# Patient Record
Sex: Female | Born: 1995 | Race: Black or African American | Hispanic: No | Marital: Single | Smoking: Never smoker
Health system: Southern US, Community
[De-identification: ages and names within clinical notes are randomized; demographics above are authoritative.]

## PROBLEM LIST (undated history)

## (undated) ENCOUNTER — Inpatient Hospital Stay (HOSPITAL_COMMUNITY): Payer: Self-pay

## (undated) DIAGNOSIS — O139 Gestational [pregnancy-induced] hypertension without significant proteinuria, unspecified trimester: Secondary | ICD-10-CM

## (undated) DIAGNOSIS — Z789 Other specified health status: Secondary | ICD-10-CM

## (undated) HISTORY — PX: NO PAST SURGERIES: SHX2092

## (undated) HISTORY — DX: Gestational (pregnancy-induced) hypertension without significant proteinuria, unspecified trimester: O13.9

---

## 2016-06-15 NOTE — L&D Delivery Note (Signed)
Delivery Note At 4:44 PM a viable female was delivered via Vaginal, Spontaneous (Presentation:LOA).  APGAR: pending weight: pending.   Placenta status:3v cord- intact  with the following complications: none   Anesthesia:  Epidural  Episiotomy: None Lacerations: small 1st degree lac- not repaired Est. Blood Loss (mL): 300  Mom to postpartum.  Baby to Couplet care / Skin to Skin.  Willodean RosenthalCarolyn Harraway-Smith 06/13/2017, 4:59 PM

## 2017-02-08 ENCOUNTER — Other Ambulatory Visit (HOSPITAL_COMMUNITY)
Admission: RE | Admit: 2017-02-08 | Discharge: 2017-02-08 | Disposition: A | Discharge status: Home / Self Care | Payer: Medicaid Other | Source: Ambulatory Visit | Attending: Advanced Practice Midwife | Admitting: Advanced Practice Midwife

## 2017-02-08 ENCOUNTER — Ambulatory Visit (INDEPENDENT_AMBULATORY_CARE_PROVIDER_SITE_OTHER): Payer: Medicaid Other | Admitting: Advanced Practice Midwife

## 2017-02-08 ENCOUNTER — Encounter: Payer: Self-pay | Admitting: Advanced Practice Midwife

## 2017-02-08 VITALS — BP 125/77 | HR 101 | Ht 71.0 in | Wt 222.0 lb

## 2017-02-08 DIAGNOSIS — Z3A19 19 weeks gestation of pregnancy: Secondary | ICD-10-CM | POA: Diagnosis not present

## 2017-02-08 DIAGNOSIS — Z3481 Encounter for supervision of other normal pregnancy, first trimester: Secondary | ICD-10-CM

## 2017-02-08 DIAGNOSIS — O0932 Supervision of pregnancy with insufficient antenatal care, second trimester: Secondary | ICD-10-CM | POA: Diagnosis not present

## 2017-02-08 DIAGNOSIS — O26842 Uterine size-date discrepancy, second trimester: Secondary | ICD-10-CM | POA: Insufficient documentation

## 2017-02-08 DIAGNOSIS — Z3400 Encounter for supervision of normal first pregnancy, unspecified trimester: Secondary | ICD-10-CM

## 2017-02-08 DIAGNOSIS — Z3402 Encounter for supervision of normal first pregnancy, second trimester: Secondary | ICD-10-CM

## 2017-02-08 DIAGNOSIS — Z363 Encounter for antenatal screening for malformations: Secondary | ICD-10-CM

## 2017-02-08 MED ORDER — CONCEPT DHA 53.5-38-1 MG PO CAPS: 1.0000 | ORAL_CAPSULE | Freq: Every day | ORAL | 12 refills | Status: DC

## 2017-02-08 NOTE — Patient Instructions (Signed)

## 2017-02-08 NOTE — Progress Notes (Signed)
[facility] Pt states she was given a due date of 07/04/16, by u/s there. Pt does not know date of last cycle.

## 2017-02-09 LAB — CERVICOVAGINAL ANCILLARY ONLY
Bacterial vaginitis: POSITIVE — AB
CANDIDA VAGINITIS: NEGATIVE
Chlamydia: NEGATIVE
Neisseria Gonorrhea: NEGATIVE
Trichomonas: NEGATIVE

## 2017-02-10 LAB — OBSTETRIC PANEL, INCLUDING HIV
ANTIBODY SCREEN: NEGATIVE
BASOS: 0 %
Basophils Absolute: 0 10*3/uL (ref 0.0–0.2)
EOS (ABSOLUTE): 0.1 10*3/uL (ref 0.0–0.4)
Eos: 1 %
HEMATOCRIT: 33.9 % — AB (ref 34.0–46.6)
HIV SCREEN 4TH GENERATION: NONREACTIVE
Hemoglobin: 11.1 g/dL (ref 11.1–15.9)
Hepatitis B Surface Ag: NEGATIVE
Immature Grans (Abs): 0 10*3/uL (ref 0.0–0.1)
Immature Granulocytes: 0 %
LYMPHS ABS: 1.1 10*3/uL (ref 0.7–3.1)
Lymphs: 20 %
MCH: 27 pg (ref 26.6–33.0)
MCHC: 32.7 g/dL (ref 31.5–35.7)
MCV: 83 fL (ref 79–97)
MONOS ABS: 0.4 10*3/uL (ref 0.1–0.9)
Monocytes: 7 %
NEUTROS ABS: 4 10*3/uL (ref 1.4–7.0)
Neutrophils: 72 %
Platelets: 235 10*3/uL (ref 150–379)
RBC: 4.11 x10E6/uL (ref 3.77–5.28)
RDW: 15.5 % — AB (ref 12.3–15.4)
RPR Ser Ql: NONREACTIVE
Rh Factor: POSITIVE
Rubella Antibodies, IGG: 12.3 index (ref >0.99)
WBC: 5.7 10*3/uL (ref 3.4–10.8)

## 2017-02-10 LAB — CULTURE, OB URINE

## 2017-02-10 LAB — HEMOGLOBINOPATHY EVALUATION
HEMOGLOBIN A2 QUANTITATION: 2.4 % (ref 1.8–3.2)
HGB A: 97.6 % (ref 96.4–98.8)
HGB C: 0 %
HGB S: 0 %
HGB VARIANT: 0 %
Hemoglobin F Quantitation: 0 % (ref 0.0–2.0)

## 2017-02-10 LAB — URINE CULTURE, OB REFLEX

## 2017-02-10 LAB — VARICELLA ZOSTER ANTIBODY, IGG: Varicella zoster IgG: >4000 index (ref >165)

## 2017-02-10 LAB — VITAMIN D 25 HYDROXY (VIT D DEFICIENCY, FRACTURES): Vit D, 25-Hydroxy: 22.5 ng/mL — ABNORMAL LOW (ref 30.0–100.0)

## 2017-02-11 NOTE — Progress Notes (Signed)
Subjective:    Audrey Butler is being seen today for her first obstetrical visit.  This is not a planned pregnancy. She is 13 week by uncertain LMP, but at 3142w1d gestation [facility]. Does not have records. Her obstetrical history is significant for nulliparity.  Relationship with FOB: significant other, living together. Patient does intend to breast feed. Pregnancy history fully reviewed.  Patient reports no complaints.  Review of Systems:   Review of Systems  Constitutional: Negative for chills and fever.  Gastrointestinal: Negative for abdominal pain, nausea and vomiting.  Genitourinary: Negative for vaginal bleeding and vaginal discharge.    Objective:     BP 125/77   Pulse (!) 101   Ht 5\' 11"  (1.803 m)   Wt 222 lb (100.7 kg)   LMP 11/08/2016   BMI 30.96 kg/m  Physical Exam  Constitutional: She is oriented to person, place, and time. She appears well-developed and well-nourished. No distress.  Eyes: No scleral icterus.  Cardiovascular: Normal rate and regular rhythm.   Respiratory: Effort normal and breath sounds normal. No respiratory distress.  GI: Soft. There is no tenderness.  Fundal ht 1/U  Genitourinary: Vagina normal. No vaginal discharge found.  Musculoskeletal: She exhibits no edema.  Neurological: She is alert and oriented to person, place, and time. She has normal reflexes.  Skin: Skin is warm and dry.  Psychiatric: She has a normal mood and affect.    Maternal Exam:  Introitus: Vagina is negative for discharge.    Pos FHR   Assessment:    Pregnancy: G1P0 Patient Active Problem List   Diagnosis Date Noted  . Supervision of normal first pregnancy, antepartum 02/08/2017     1. Supervision of normal first pregnancy, antepartum  - Hemoglobinopathy evaluation - Varicella zoster antibody, IgG - VITAMIN D 25 Hydroxy (Vit-D Deficiency, Fractures) - Culture, OB Urine - Obstetric Panel, Including HIV - Cervicovaginal ancillary  only - Enroll Patient in Babyscripts - Babyscripts Schedule Optimization - US MFM OB DETAIL +14 WK; Future - Prenat-FeFum-FePo-FA-Omega 3 (CONCEPT DHA) 53.5-38-1 MG CAPS; Take 1 tablet by mouth daily.  Dispense: 30 capsule; Refill: 12  2. Limited prenatal care in second trimester  - Hemoglobinopathy evaluation - Varicella zoster antibody, IgG - VITAMIN D 25 Hydroxy (Vit-D Deficiency, Fractures) - Culture, OB Urine - Obstetric Panel, Including HIV - Cervicovaginal ancillary only - Enroll Patient in Babyscripts - Babyscripts Schedule Optimization - US MFM OB DETAIL +14 WK; Future  3. Uterine size date discrepancy pregnancy, second trimester  - Hemoglobinopathy evaluation - Varicella zoster antibody, IgG - VITAMIN D 25 Hydroxy (Vit-D Deficiency, Fractures) - Culture, OB Urine - Obstetric Panel, Including HIV - Cervicovaginal ancillary only - Enroll Patient in Babyscripts - Babyscripts Schedule Optimization - US MFM OB DETAIL +14 WK; Future  4. Screening, antenatal, for malformation by ultrasound  - Hemoglobinopathy evaluation - Varicella zoster antibody, IgG - VITAMIN D 25 Hydroxy (Vit-D Deficiency, Fractures) - Culture, OB Urine - Obstetric Panel, Including HIV - Cervicovaginal ancillary only - Enroll Patient in Babyscripts - Babyscripts Schedule Optimization - US MFM OB DETAIL +14 WK; Future  5. [redacted] weeks gestation of pregnancy  - Hemoglobinopathy evaluation - Varicella zoster antibody, IgG - VITAMIN D 25 Hydroxy (Vit-D Deficiency, Fractures) - Culture, OB Urine - Obstetric Panel, Including HIV - Cervicovaginal ancillary only - Enroll Patient in Babyscripts - Babyscripts Schedule Optimization - US MFM OB DETAIL +14 WK; Future    Plan:     Initial labs drawn. Prenatal  vitamins. Problem list reviewed and updated. AFP3 discussed: declined. Role of ultrasound in pregnancy discussed; fetal survey: ordered. Amniocentesis discussed: not indicated. Follow up in 4  weeks.    Audrey Butler 02/11/2017

## 2017-02-23 ENCOUNTER — Encounter (HOSPITAL_COMMUNITY): Payer: Self-pay

## 2017-02-23 ENCOUNTER — Other Ambulatory Visit (HOSPITAL_COMMUNITY): Payer: Self-pay | Admitting: *Deleted

## 2017-02-23 ENCOUNTER — Ambulatory Visit (HOSPITAL_COMMUNITY)
Admission: RE | Admit: 2017-02-23 | Discharge: 2017-02-23 | Disposition: A | Discharge status: Home / Self Care | Payer: Medicaid Other | Source: Ambulatory Visit | Attending: Advanced Practice Midwife | Admitting: Advanced Practice Midwife

## 2017-02-23 DIAGNOSIS — O26842 Uterine size-date discrepancy, second trimester: Secondary | ICD-10-CM | POA: Diagnosis not present

## 2017-02-23 DIAGNOSIS — Z3A21 21 weeks gestation of pregnancy: Secondary | ICD-10-CM | POA: Diagnosis not present

## 2017-02-23 DIAGNOSIS — O0932 Supervision of pregnancy with insufficient antenatal care, second trimester: Secondary | ICD-10-CM | POA: Diagnosis not present

## 2017-02-23 DIAGNOSIS — Z3400 Encounter for supervision of normal first pregnancy, unspecified trimester: Secondary | ICD-10-CM

## 2017-02-23 DIAGNOSIS — Z363 Encounter for antenatal screening for malformations: Secondary | ICD-10-CM | POA: Diagnosis not present

## 2017-02-23 DIAGNOSIS — Z3A19 19 weeks gestation of pregnancy: Secondary | ICD-10-CM

## 2017-02-23 DIAGNOSIS — Z0489 Encounter for examination and observation for other specified reasons: Secondary | ICD-10-CM

## 2017-02-23 DIAGNOSIS — IMO0002 Reserved for concepts with insufficient information to code with codable children: Secondary | ICD-10-CM

## 2017-02-23 HISTORY — DX: Other specified health status: Z78.9

## 2017-03-08 ENCOUNTER — Encounter: Payer: Self-pay | Admitting: Certified Nurse Midwife

## 2017-03-08 ENCOUNTER — Ambulatory Visit (INDEPENDENT_AMBULATORY_CARE_PROVIDER_SITE_OTHER): Payer: Medicaid Other | Admitting: Certified Nurse Midwife

## 2017-03-08 DIAGNOSIS — Z3402 Encounter for supervision of normal first pregnancy, second trimester: Secondary | ICD-10-CM

## 2017-03-08 DIAGNOSIS — Z3400 Encounter for supervision of normal first pregnancy, unspecified trimester: Secondary | ICD-10-CM

## 2017-03-08 NOTE — Progress Notes (Signed)
   PRENATAL VISIT NOTE  Subjective:  Audrey Butler is a 21 y.o. G1P0 at [redacted]w[redacted]d being seen today for ongoing prenatal care.  She is currently monitored for the following issues for this low-risk pregnancy and has Supervision of normal first pregnancy, antepartum on her problem list.  Patient reports no complaints.  Contractions: Not present. Vag. Bleeding: None.  Movement: Present. Denies leaking of fluid.   The following portions of the patient's history were reviewed and updated as appropriate: allergies, current medications, past family history, past medical history, past social history, past surgical history and problem list. Problem list updated.  Objective:   Vitals:   03/08/17 1506  BP: 131/81  Pulse: 89  Weight: 229 lb 9.6 oz (104.1 kg)    Fetal Status: Fetal Heart Rate (bpm): 138; doppler Fundal Height: 23 cm Movement: Present     General:  Alert, oriented and cooperative. Patient is in no acute distress.  Skin: Skin is warm and dry. No rash noted.   Cardiovascular: Normal heart rate noted  Respiratory: Normal respiratory effort, no problems with respiration noted  Abdomen: Soft, gravid, appropriate for gestational age.  Pain/Pressure: Absent     Pelvic: Cervical exam deferred        Extremities: Normal range of motion.  Edema: None  Mental Status:  Normal mood and affect. Normal behavior. Normal judgment and thought content.   Assessment and Plan:  Pregnancy: G1P0 at 106w1d  1. Supervision of normal first pregnancy, antepartum      Doing well.     Preterm labor symptoms and general obstetric precautions including but not limited to vaginal bleeding, contractions, leaking of fluid and fetal movement were reviewed in detail with the patient. Please refer to After Visit Summary for other counseling recommendations.  Return in about 4 weeks (around 04/05/2017) for ROB, 2 hr OGTT.   Roe Coombs, CNM

## 2017-03-08 NOTE — Patient Instructions (Signed)
AREA PEDIATRIC/FAMILY PRACTICE PHYSICIANS  [facility] [address], [address] [place], [place]  [address] Phone - [phone]   Fax - [phone]  [facility] [address] [place], [place] [address] Phone - [phone]   Fax - [phone]  [facility] [address] [place], [place]  [address] Phone - [phone]   Fax - [phone]  [facility] [address] [place], [place]  [address] Phone - [phone]   Fax - [phone]  [facility] [address] [place], [place]  [address] Phone - [phone]   Fax - [phone]  [facility] [address], [address] [place], [place]  [address] Phone - [phone]   Fax - [phone]  [facility] [address] [place], [place]  [address] Phone - [phone]   Fax - [phone]  [facility] [address], [address] [place], [place]  [address] Phone - [phone]   Fax - [phone]  [facility] [address] [place], [place]  [address] Phone - [phone]   Fax - [phone] [facility] [address] [place], [place]  [address] Phone - [phone]   Fax - [phone]  [facility] [address] [place], [place]  [address] Phone - [phone]   Fax - [phone]  [facility] [address] [place], [place]  [address] Phone - [phone]   Fax - [phone]  [facility] [address], [address] [place], [place]  [address] Phone - [phone]   Fax - [phone]  [facility] [address] [place], [place]  [address] Phone - [phone]  [facility] [address] [place], [place]  [address] Phone - [phone]   Fax - [phone]  [facility] [address] [place], [place]  [address] Phone - [phone]   Fax - [phone]  [facility] [address] [place], [place]  [address] Phone - [phone]   Fax - [phone]  [facility] [address] [place], [place]  [address] Phone - [phone]   Fax - [phone]   [facility] [address] [place], [place]  [address] Phone - [phone]   Fax - [phone]  [facility] [address] [place], [place]  [address] Phone - [phone]   Fax - [phone]  [facility] [address] [place], [place]  [address] Phone - [phone]   Fax - [phone]  [facility] [address] [place], [place]  [address] Phone - [phone]   Fax - [phone]  [facility] [address] [place], [place]  [address] Phone - [phone]   Fax - [phone] [facility] [address] [place], [place]  [address] Phone - [phone]   Fax - [phone]  [facility] [address] [place], [place]  [address] Phone - [phone]   Fax - [phone]  [facility] [address] [place], [place]  [address] Phone - [phone]   Fax - [phone]  [facility] Charlene Flemming MD [address] [place] [address] Phone [phone]  Fax [phone]   

## 2017-03-08 NOTE — Progress Notes (Signed)
Patient reports good fetal movement, denies pain. 

## 2017-03-23 ENCOUNTER — Other Ambulatory Visit (HOSPITAL_COMMUNITY): Payer: Self-pay | Admitting: Maternal & Fetal Medicine

## 2017-03-23 ENCOUNTER — Ambulatory Visit (HOSPITAL_COMMUNITY)
Admission: RE | Admit: 2017-03-23 | Discharge: 2017-03-23 | Disposition: A | Discharge status: Home / Self Care | Payer: Medicaid Other | Source: Ambulatory Visit | Attending: Advanced Practice Midwife | Admitting: Advanced Practice Midwife

## 2017-03-23 DIAGNOSIS — O99212 Obesity complicating pregnancy, second trimester: Secondary | ICD-10-CM | POA: Diagnosis not present

## 2017-03-23 DIAGNOSIS — Z0489 Encounter for examination and observation for other specified reasons: Secondary | ICD-10-CM

## 2017-03-23 DIAGNOSIS — Z3A25 25 weeks gestation of pregnancy: Secondary | ICD-10-CM

## 2017-03-23 DIAGNOSIS — O0932 Supervision of pregnancy with insufficient antenatal care, second trimester: Secondary | ICD-10-CM | POA: Diagnosis not present

## 2017-03-23 DIAGNOSIS — IMO0002 Reserved for concepts with insufficient information to code with codable children: Secondary | ICD-10-CM

## 2017-03-23 DIAGNOSIS — Z362 Encounter for other antenatal screening follow-up: Secondary | ICD-10-CM | POA: Insufficient documentation

## 2017-04-05 ENCOUNTER — Other Ambulatory Visit: Payer: Medicaid Other

## 2017-04-05 ENCOUNTER — Ambulatory Visit (INDEPENDENT_AMBULATORY_CARE_PROVIDER_SITE_OTHER): Payer: Medicaid Other | Admitting: Certified Nurse Midwife

## 2017-04-05 VITALS — BP 122/79 | HR 78 | Wt 232.2 lb

## 2017-04-05 DIAGNOSIS — Z3402 Encounter for supervision of normal first pregnancy, second trimester: Secondary | ICD-10-CM

## 2017-04-05 DIAGNOSIS — Z3400 Encounter for supervision of normal first pregnancy, unspecified trimester: Secondary | ICD-10-CM

## 2017-04-05 NOTE — Progress Notes (Signed)
Patient reports good fetal movement, denies pain. 

## 2017-04-05 NOTE — Progress Notes (Signed)
   PRENATAL VISIT NOTE  Subjective:  Audrey Butler is a 21 y.o. G1P0 at 4845w1d being seen today for ongoing prenatal care.  She is currently monitored for the following issues for this low-risk pregnancy and has Supervision of normal first pregnancy, antepartum on her problem list.  Patient reports no complaints.  Contractions: Not present. Vag. Bleeding: None.  Movement: Present. Denies leaking of fluid.   The following portions of the patient's history were reviewed and updated as appropriate: allergies, current medications, past family history, past medical history, past social history, past surgical history and problem list. Problem list updated.  Objective:   Vitals:   04/05/17 0819  BP: 122/79  Pulse: 78  Weight: 232 lb 3.2 oz (105.3 kg)    Fetal Status: Fetal Heart Rate (bpm): 136; doppler Fundal Height: 26 cm Movement: Present     General:  Alert, oriented and cooperative. Patient is in no acute distress.  Skin: Skin is warm and dry. No rash noted.   Cardiovascular: Normal heart rate noted  Respiratory: Normal respiratory effort, no problems with respiration noted  Abdomen: Soft, gravid, appropriate for gestational age.  Pain/Pressure: Absent     Pelvic: Cervical exam deferred        Extremities: Normal range of motion.  Edema: None  Mental Status:  Normal mood and affect. Normal behavior. Normal judgment and thought content.   Assessment and Plan:  Pregnancy: G1P0 at 5245w1d  1. Supervision of normal first pregnancy, antepartum      Doing well.  - Glucose Tolerance, 2 Hours w/1 Hour - RPR - CBC - HIV antibody (with reflex)  Preterm labor symptoms and general obstetric precautions including but not limited to vaginal bleeding, contractions, leaking of fluid and fetal movement were reviewed in detail with the patient. Please refer to After Visit Summary for other counseling recommendations.  Return in about 2 weeks (around 04/19/2017) for ROB.   [name],  CNM

## 2017-04-06 LAB — CBC
HEMATOCRIT: 33.4 % — AB (ref 34.0–46.6)
HEMOGLOBIN: 10.6 g/dL — AB (ref 11.1–15.9)
MCH: 26.9 pg (ref 26.6–33.0)
MCHC: 31.7 g/dL (ref 31.5–35.7)
MCV: 85 fL (ref 79–97)
Platelets: 228 10*3/uL (ref 150–379)
RBC: 3.94 x10E6/uL (ref 3.77–5.28)
RDW: 15.1 % (ref 12.3–15.4)
WBC: 6.2 10*3/uL (ref 3.4–10.8)

## 2017-04-06 LAB — GLUCOSE TOLERANCE, 2 HOURS W/ 1HR
GLUCOSE, 2 HOUR: 114 mg/dL (ref 65–152)
Glucose, 1 hour: 134 mg/dL (ref 65–179)
Glucose, Fasting: 87 mg/dL (ref 65–91)

## 2017-04-06 LAB — RPR: RPR Ser Ql: NONREACTIVE

## 2017-04-06 LAB — HIV ANTIBODY (ROUTINE TESTING W REFLEX): HIV Screen 4th Generation wRfx: NONREACTIVE

## 2017-04-08 ENCOUNTER — Other Ambulatory Visit: Payer: Self-pay | Admitting: Certified Nurse Midwife

## 2017-04-08 DIAGNOSIS — Z3400 Encounter for supervision of normal first pregnancy, unspecified trimester: Secondary | ICD-10-CM

## 2017-04-08 DIAGNOSIS — O99013 Anemia complicating pregnancy, third trimester: Secondary | ICD-10-CM

## 2017-04-08 MED ORDER — CITRANATAL BLOOM 90-1 MG PO TABS
1.0000 | ORAL_TABLET | Freq: Every day | ORAL | 12 refills | Status: DC
Start: 2017-04-08 — End: 2020-07-05

## 2017-04-19 ENCOUNTER — Ambulatory Visit (INDEPENDENT_AMBULATORY_CARE_PROVIDER_SITE_OTHER): Payer: Medicaid Other | Admitting: Certified Nurse Midwife

## 2017-04-19 ENCOUNTER — Encounter: Payer: Self-pay | Admitting: *Deleted

## 2017-04-19 VITALS — BP 135/87 | HR 83 | Wt 237.7 lb

## 2017-04-19 DIAGNOSIS — Z3400 Encounter for supervision of normal first pregnancy, unspecified trimester: Secondary | ICD-10-CM

## 2017-04-19 DIAGNOSIS — D649 Anemia, unspecified: Secondary | ICD-10-CM

## 2017-04-19 DIAGNOSIS — O99013 Anemia complicating pregnancy, third trimester: Secondary | ICD-10-CM

## 2017-04-19 NOTE — Progress Notes (Signed)
   PRENATAL VISIT NOTE  Subjective:  Audrey Butler is a 21 y.o. G1P0 at 6328w1d being seen today for ongoing prenatal care.  She is currently monitored for the following issues for this low-risk pregnancy and has Supervision of normal first pregnancy, antepartum and Anemia affecting pregnancy in third trimester on their problem list.  Patient reports no complaints.  Contractions: Not present. Vag. Bleeding: None.  Movement: Present. Denies leaking of fluid.   The following portions of the patient's history were reviewed and updated as appropriate: allergies, current medications, past family history, past medical history, past social history, past surgical history and problem list. Problem list updated.  Objective:   Vitals:   04/19/17 1108  BP: 135/87  Pulse: 83  Weight: 237 lb 11.2 oz (107.8 kg)    Fetal Status: Fetal Heart Rate (bpm): 134; doppler Fundal Height: 30 cm Movement: Present     General:  Alert, oriented and cooperative. Patient is in no acute distress.  Skin: Skin is warm and dry. No rash noted.   Cardiovascular: Normal heart rate noted  Respiratory: Normal respiratory effort, no problems with respiration noted  Abdomen: Soft, gravid, appropriate for gestational age.  Pain/Pressure: Absent     Pelvic: Cervical exam deferred        Extremities: Normal range of motion.  Edema: None  Mental Status:  Normal mood and affect. Normal behavior. Normal judgment and thought content.   Assessment and Plan:  Pregnancy: G1P0 at 6928w1d  1. Supervision of normal first pregnancy, antepartum     DOing well.   2. Anemia affecting pregnancy in third trimester      Taking Bloom.   Preterm labor symptoms and general obstetric precautions including but not limited to vaginal bleeding, contractions, leaking of fluid and fetal movement were reviewed in detail with the patient. Please refer to After Visit Summary for other counseling recommendations.  Return in about 2 weeks (around  05/03/2017) for ROB.   Roe Coombsachelle A Vedansh Kerstetter, CNM

## 2017-04-19 NOTE — Progress Notes (Signed)
Pt denies any issues at this time.

## 2017-05-03 ENCOUNTER — Ambulatory Visit (INDEPENDENT_AMBULATORY_CARE_PROVIDER_SITE_OTHER): Payer: Medicaid Other | Admitting: Certified Nurse Midwife

## 2017-05-03 ENCOUNTER — Encounter: Payer: Self-pay | Admitting: Certified Nurse Midwife

## 2017-05-03 ENCOUNTER — Other Ambulatory Visit: Payer: Self-pay

## 2017-05-03 VITALS — BP 139/85 | HR 86 | Wt 238.0 lb

## 2017-05-03 DIAGNOSIS — O99013 Anemia complicating pregnancy, third trimester: Secondary | ICD-10-CM

## 2017-05-03 DIAGNOSIS — Z3400 Encounter for supervision of normal first pregnancy, unspecified trimester: Secondary | ICD-10-CM

## 2017-05-03 NOTE — Progress Notes (Signed)
   PRENATAL VISIT NOTE  Subjective:  Audrey Butler is a 21 y.o. G1P0 at 4568w1d being seen today for ongoing prenatal care.  She is currently monitored for the following issues for this low-risk pregnancy and has Supervision of normal first pregnancy, antepartum and Anemia affecting pregnancy in third trimester on their problem list.  Patient reports no complaints.  Contractions: Not present. Vag. Bleeding: None.  Movement: Present. Denies leaking of fluid.   The following portions of the patient's history were reviewed and updated as appropriate: allergies, current medications, past family history, past medical history, past social history, past surgical history and problem list. Problem list updated.  Objective:   Vitals:   05/03/17 1112  BP: 139/85  Pulse: 86  Weight: 238 lb (108 kg)    Fetal Status: Fetal Heart Rate (bpm): 140; doppler Fundal Height: 31 cm Movement: Present     General:  Alert, oriented and cooperative. Patient is in no acute distress.  Skin: Skin is warm and dry. No rash noted.   Cardiovascular: Normal heart rate noted  Respiratory: Normal respiratory effort, no problems with respiration noted  Abdomen: Soft, gravid, appropriate for gestational age.  Pain/Pressure: Present     Pelvic: Cervical exam deferred        Extremities: Normal range of motion.  Edema: None  Mental Status:  Normal mood and affect. Normal behavior. Normal judgment and thought content.   Assessment and Plan:  Pregnancy: G1P0 at 5068w1d  1. Supervision of normal first pregnancy, antepartum     Doing well.  2. Anemia affecting pregnancy in third trimester     Taking Bloom.   Preterm labor symptoms and general obstetric precautions including but not limited to vaginal bleeding, contractions, leaking of fluid and fetal movement were reviewed in detail with the patient. Please refer to After Visit Summary for other counseling recommendations.  Return in about 2 weeks (around 05/17/2017)  for ROB.   Roe Coombsachelle A Chiara Coltrin, CNM

## 2017-05-04 ENCOUNTER — Other Ambulatory Visit: Payer: Self-pay | Admitting: Certified Nurse Midwife

## 2017-05-17 ENCOUNTER — Ambulatory Visit (INDEPENDENT_AMBULATORY_CARE_PROVIDER_SITE_OTHER): Payer: Medicaid Other | Admitting: Certified Nurse Midwife

## 2017-05-17 ENCOUNTER — Encounter: Payer: Self-pay | Admitting: Certified Nurse Midwife

## 2017-05-17 VITALS — BP 131/79 | HR 91 | Wt 242.8 lb

## 2017-05-17 DIAGNOSIS — Z3403 Encounter for supervision of normal first pregnancy, third trimester: Secondary | ICD-10-CM

## 2017-05-17 DIAGNOSIS — O163 Unspecified maternal hypertension, third trimester: Secondary | ICD-10-CM

## 2017-05-17 DIAGNOSIS — Z3400 Encounter for supervision of normal first pregnancy, unspecified trimester: Secondary | ICD-10-CM

## 2017-05-17 DIAGNOSIS — O99013 Anemia complicating pregnancy, third trimester: Secondary | ICD-10-CM

## 2017-05-17 NOTE — Progress Notes (Signed)
Patient reports good fetal movement, denies pain. 

## 2017-05-17 NOTE — Progress Notes (Signed)
   PRENATAL VISIT NOTE  Subjective:  Audrey Butler is a 21 y.o. G1P0 at 7775w1d being seen today for ongoing prenatal care.  She is currently monitored for the following issues for this low-risk pregnancy and has Supervision of normal first pregnancy, antepartum and Anemia affecting pregnancy in third trimester on their problem list.  Patient reports no complaints.  Contractions: Not present. Vag. Bleeding: None.  Movement: Present. Denies leaking of fluid.   The following portions of the patient's history were reviewed and updated as appropriate: allergies, current medications, past family history, past medical history, past social history, past surgical history and problem list. Problem list updated.  Objective:   Vitals:   05/17/17 0936  BP: 131/79  Pulse: 91  Weight: 110.1 kg (242 lb 12.8 oz)    Fetal Status: Fetal Heart Rate (bpm): 142; doppler Fundal Height: 33 cm Movement: Present     General:  Alert, oriented and cooperative. Patient is in no acute distress.  Skin: Skin is warm and dry. No rash noted.   Cardiovascular: Normal heart rate noted  Respiratory: Normal respiratory effort, no problems with respiration noted  Abdomen: Soft, gravid, appropriate for gestational age.  Pain/Pressure: Absent     Pelvic: Cervical exam deferred        Extremities: Normal range of motion.  Edema: None  Mental Status:  Normal mood and affect. Normal behavior. Normal judgment and thought content.   Assessment and Plan:  Pregnancy: G1P0 at 8475w1d  1. Supervision of normal first pregnancy, antepartum     Doing well.  1st blood pressure was elevated: normotensive on recheck.  Baseline labs obtained as a precaution.    2. Anemia affecting pregnancy in third trimester     Completed Bloom  3. Elevated blood pressure affecting pregnancy in third trimester, antepartum      R/O Pre-E.  - Comprehensive metabolic panel - CBC - Protein / creatinine ratio, urine  Preterm labor symptoms and  general obstetric precautions including but not limited to vaginal bleeding, contractions, leaking of fluid and fetal movement were reviewed in detail with the patient. Please refer to After Visit Summary for other counseling recommendations.  Return in about 2 weeks (around 05/31/2017) for ROB, GBS.   Roe Coombsachelle A Chessie Neuharth, CNM

## 2017-05-18 LAB — COMPREHENSIVE METABOLIC PANEL
ALBUMIN: 3.7 g/dL (ref 3.5–5.5)
ALK PHOS: 126 IU/L — AB (ref 39–117)
ALT: 6 IU/L (ref 0–32)
AST: 14 IU/L (ref 0–40)
Albumin/Globulin Ratio: 1.6 (ref 1.2–2.2)
BILIRUBIN TOTAL: 0.3 mg/dL (ref 0.0–1.2)
BUN / CREAT RATIO: 5 — AB (ref 9–23)
BUN: 3 mg/dL — ABNORMAL LOW (ref 6–20)
CHLORIDE: 106 mmol/L (ref 96–106)
CO2: 22 mmol/L (ref 20–29)
CREATININE: 0.59 mg/dL (ref 0.57–1.00)
Calcium: 8.9 mg/dL (ref 8.7–10.2)
GFR calc non Af Amer: 132 mL/min/{1.73_m2} (ref >59)
GFR, EST AFRICAN AMERICAN: 153 mL/min/{1.73_m2} (ref >59)
GLOBULIN, TOTAL: 2.3 g/dL (ref 1.5–4.5)
Glucose: 94 mg/dL (ref 65–99)
Potassium: 3.8 mmol/L (ref 3.5–5.2)
SODIUM: 143 mmol/L (ref 134–144)
TOTAL PROTEIN: 6 g/dL (ref 6.0–8.5)

## 2017-05-18 LAB — CBC
Hematocrit: 35.6 % (ref 34.0–46.6)
Hemoglobin: 11.4 g/dL (ref 11.1–15.9)
MCH: 26.4 pg — ABNORMAL LOW (ref 26.6–33.0)
MCHC: 32 g/dL (ref 31.5–35.7)
MCV: 82 fL (ref 79–97)
PLATELETS: 201 10*3/uL (ref 150–379)
RBC: 4.32 x10E6/uL (ref 3.77–5.28)
RDW: 15.3 % (ref 12.3–15.4)
WBC: 7.3 10*3/uL (ref 3.4–10.8)

## 2017-05-18 LAB — PROTEIN / CREATININE RATIO, URINE
Creatinine, Urine: 109.7 mg/dL
PROTEIN/CREAT RATIO: 115 mg/g{creat} (ref 0–200)
Protein, Ur: 12.6 mg/dL

## 2017-05-31 ENCOUNTER — Other Ambulatory Visit (HOSPITAL_COMMUNITY)
Admission: RE | Admit: 2017-05-31 | Discharge: 2017-05-31 | Disposition: A | Discharge status: Home / Self Care | Payer: Medicaid Other | Source: Ambulatory Visit | Attending: Certified Nurse Midwife | Admitting: Certified Nurse Midwife

## 2017-05-31 ENCOUNTER — Ambulatory Visit (INDEPENDENT_AMBULATORY_CARE_PROVIDER_SITE_OTHER): Payer: Medicaid Other | Admitting: Certified Nurse Midwife

## 2017-05-31 ENCOUNTER — Encounter: Payer: Self-pay | Admitting: Certified Nurse Midwife

## 2017-05-31 VITALS — BP 133/88 | HR 105 | Wt 246.6 lb

## 2017-05-31 DIAGNOSIS — B373 Candidiasis of vulva and vagina: Secondary | ICD-10-CM | POA: Insufficient documentation

## 2017-05-31 DIAGNOSIS — N760 Acute vaginitis: Secondary | ICD-10-CM | POA: Diagnosis not present

## 2017-05-31 DIAGNOSIS — O98819 Other maternal infectious and parasitic diseases complicating pregnancy, unspecified trimester: Secondary | ICD-10-CM | POA: Diagnosis not present

## 2017-05-31 DIAGNOSIS — Z3A00 Weeks of gestation of pregnancy not specified: Secondary | ICD-10-CM | POA: Insufficient documentation

## 2017-05-31 DIAGNOSIS — Z3400 Encounter for supervision of normal first pregnancy, unspecified trimester: Secondary | ICD-10-CM

## 2017-05-31 DIAGNOSIS — B9689 Other specified bacterial agents as the cause of diseases classified elsewhere: Secondary | ICD-10-CM | POA: Insufficient documentation

## 2017-05-31 DIAGNOSIS — O26843 Uterine size-date discrepancy, third trimester: Secondary | ICD-10-CM

## 2017-05-31 DIAGNOSIS — O163 Unspecified maternal hypertension, third trimester: Secondary | ICD-10-CM

## 2017-05-31 LAB — OB RESULTS CONSOLE GBS: GBS: POSITIVE

## 2017-05-31 NOTE — Progress Notes (Signed)
   PRENATAL VISIT NOTE  Subjective:  Audrey Butler is a 21 y.o. G1P0 at 61w1dbeing seen today for ongoing prenatal care.  She is currently monitored for the following issues for this low-risk pregnancy and has Supervision of normal first pregnancy, antepartum on their problem list.  Patient reports no complaints.  Contractions: Not present. Vag. Bleeding: None.  Movement: Present. Denies leaking of fluid.   The following portions of the patient's history were reviewed and updated as appropriate: allergies, current medications, past family history, past medical history, past social history, past surgical history and problem list. Problem list updated.  Objective:   Vitals:   05/31/17 0920  BP: 133/88  Pulse: (!) 105  Weight: 246 lb 9.6 oz (111.9 kg)    Fetal Status: Fetal Heart Rate (bpm): 146; doppler Fundal Height: 39 cm Movement: Present  Presentation: Vertex  General:  Alert, oriented and cooperative. Patient is in no acute distress.  Skin: Skin is warm and dry. No rash noted.   Cardiovascular: Normal heart rate noted  Respiratory: Normal respiratory effort, no problems with respiration noted  Abdomen: Soft, gravid, appropriate for gestational age.  Pain/Pressure: Absent     Pelvic: Cervical exam performed Dilation: Closed Effacement (%): 0 Station: -3  Extremities: Normal range of motion.  Edema: None  Mental Status:  Normal mood and affect. Normal behavior. Normal judgment and thought content.   Assessment and Plan:  Pregnancy: G1P0 at 37w1d1. Supervision of normal first pregnancy, antepartum      - Strep Gp B NAA - Cervicovaginal ancillary only  2. Elevated blood pressure affecting pregnancy in third trimester, antepartum    Normotensive on recheck a few minutes later.  Baseline labs obtained.   - Comp Met (CMET) - CBC - Protein / creatinine ratio, urine  3. Uterine size date discrepancy pregnancy, third trimester     S>D - [place] OB FOLLOW UP;  Future  Preterm labor symptoms and general obstetric precautions including but not limited to vaginal bleeding, contractions, leaking of fluid and fetal movement were reviewed in detail with the patient. Please refer to After Visit Summary for other counseling recommendations.  Return in about 1 week (around 06/07/2017) for ROB.   RaMorene CrockerCNM

## 2017-05-31 NOTE — Patient Instructions (Addendum)
AREA PEDIATRIC/FAMILY PRACTICE PHYSICIANS  [facility] [address], [address] [place], [place]  [address] Phone - [phone]   Fax - [phone]  [facility] [address] [place], [place] [address] Phone - [phone]   Fax - [phone]  [facility] AM[facility] [address] [place], [place]  [address] Phone - [phone]   Fax - [phone]  [facility] [address] [place], [place]  [address] Phone - [phone]   Fax - [phone]  [facility] [address] [place], [place]  [address] Phone - [phone]   Fax - [phone]  [facility] [address] [place], [place]  [address] Phone - [phone]   Fax - [phone]  [facility] [address] [place], [place]  [address] Phone - [phone]   Fax - [phone]  [facility] [address], [address] [place], [place]  [address] Phone - [phone]   Fax - [phone]  [facility] [address] [place], [place]  [address] Phone - [phone]   Fax - [phone] [facility] [address] [place], [place]  [address] Phone - [phone]   Fax - [phone]  [facility] [address] [place], [place]  [address] Phone - [phone]   Fax - [phone]  [facility] [address] [place], [place]  [address] Phone - [phone]   Fax - [phone]  [facility] [address], [address] [place], [place]  [address] Phone - [phone]   Fax - [phone]  SHILPA GOSRANI [address] [place], [place]  [address] Phone - [phone]  [facility] [address] [place], [place]  [address] Phone - [phone]   Fax - [phone]  [facility] [address] [place], [place]  [address] Phone - [phone]   Fax - [phone]  [facility] [address] [place], [place]  [address] Phone - [phone]   Fax - [phone]  [facility] [address] [place], [place]  [address] Phone - [phone]   Fax - [phone]   [facility] [address] [place], [place]  [address] Phone - [phone]   Fax - [phone]  [facility] [address] [place], [place]  [address] Phone - [phone]   Fax - [phone]  DAVID RUBIN [address] [place], [place]  [address] Phone - [phone]   Fax - [phone]  [facility] [address] [place], [place]  [address] Phone - [phone]   Fax - [phone]  [facility] [address] [place], [place]  [address] Phone - [phone]   Fax - [phone] [facility] [address] [place], [place]  [address] Phone - [phone]   Fax - [phone]  [facility] [address] [place], [place]  [address] Phone - [phone]   Fax - [phone]  [facility] [address] [place], [place]  [address] Phone - [phone]   Fax - [phone]  [facility] Charlene Flemming MD [address] [place] [address] Phone [phone]  Fax [phone]  Contraception Choices Contraception (birth control) is the use of any methods or devices to prevent   pregnancy. Below are some methods to help avoid pregnancy. Hormonal methods  Contraceptive implant. This is a thin, plastic tube containing progesterone hormone. It does not contain estrogen hormone. Your health care provider inserts the tube in the inner part of the upper arm. The tube can remain in place for up to 3 years. After 3 years, the implant must be removed. The implant prevents the  ovaries from releasing an egg (ovulation), thickens the cervical mucus to prevent sperm from entering the uterus, and thins the lining of the inside of the uterus.  Progesterone-only injections. These injections are given every 3 months by your health care provider to prevent pregnancy. This synthetic progesterone hormone stops the ovaries from releasing eggs. It also thickens cervical mucus and changes the uterine lining. This makes it harder for sperm to survive in the uterus.  Birth control pills. These pills contain estrogen and progesterone hormone. They work by preventing the ovaries from releasing eggs (ovulation). They also cause the cervical mucus to thicken, preventing the sperm from entering the uterus. Birth control pills are prescribed by a health care provider.Birth control pills can also be used to treat heavy periods.  Minipill. This type of birth control pill contains only the progesterone hormone. They are taken every day of each month and must be prescribed by your health care provider.  Birth control patch. The patch contains hormones similar to those in birth control pills. It must be changed once a week and is prescribed by a health care provider.  Vaginal ring. The ring contains hormones similar to those in birth control pills. It is left in the vagina for 3 weeks, removed for 1 week, and then a new one is put back in place. The patient must be comfortable inserting and removing the ring from the vagina.A health care provider's prescription is necessary.  Emergency contraception. Emergency contraceptives prevent pregnancy after unprotected sexual intercourse. This pill can be taken right after sex or up to 5 days after unprotected sex. It is most effective the sooner you take the pills after having sexual intercourse. Most emergency contraceptive pills are available without a prescription. Check with your pharmacist. Do not use emergency contraception as your only form of birth  control. Barrier methods  Female condom. This is a thin sheath (latex or rubber) that is worn over the penis during sexual intercourse. It can be used with spermicide to increase effectiveness.  Female condom. This is a soft, loose-fitting sheath that is put into the vagina before sexual intercourse.  Diaphragm. This is a soft, latex, dome-shaped barrier that must be fitted by a health care provider. It is inserted into the vagina, along with a spermicidal jelly. It is inserted before intercourse. The diaphragm should be left in the vagina for 6 to 8 hours after intercourse.  Cervical cap. This is a round, soft, latex or plastic cup that fits over the cervix and must be fitted by a health care provider. The cap can be left in place for up to 48 hours after intercourse.  Sponge. This is a soft, circular piece of polyurethane foam. The sponge has spermicide in it. It is inserted into the vagina after wetting it and before sexual intercourse.  Spermicides. These are chemicals that kill or block sperm from entering the cervix and uterus. They come in the form of creams, jellies, suppositories, foam, or tablets. They do not require a prescription. They are inserted into the vagina with an applicator before having sexual intercourse. The process   must be repeated every time you have sexual intercourse. Intrauterine contraception  Intrauterine device (IUD). This is a T-shaped device that is put in a woman's uterus during a menstrual period to prevent pregnancy. There are 2 types: ? Copper IUD. This type of IUD is wrapped in copper wire and is placed inside the uterus. Copper makes the uterus and fallopian tubes produce a fluid that kills sperm. It can stay in place for 10 years. ? Hormone IUD. This type of IUD contains the hormone progestin (synthetic progesterone). The hormone thickens the cervical mucus and prevents sperm from entering the uterus, and it also thins the uterine lining to prevent  implantation of a fertilized egg. The hormone can weaken or kill the sperm that get into the uterus. It can stay in place for 3-5 years, depending on which type of IUD is used. Permanent methods of contraception  Female tubal ligation. This is when the woman's fallopian tubes are surgically sealed, tied, or blocked to prevent the egg from traveling to the uterus.  Hysteroscopic sterilization. This involves placing a small coil or insert into each fallopian tube. Your doctor uses a technique called hysteroscopy to do the procedure. The device causes scar tissue to form. This results in permanent blockage of the fallopian tubes, so the sperm cannot fertilize the egg. It takes about 3 months after the procedure for the tubes to become blocked. You must use another form of birth control for these 3 months.  Female sterilization. This is when the female has the tubes that carry sperm tied off (vasectomy).This blocks sperm from entering the vagina during sexual intercourse. After the procedure, the man can still ejaculate fluid (semen). Natural planning methods  Natural family planning. This is not having sexual intercourse or using a barrier method (condom, diaphragm, cervical cap) on days the woman could become pregnant.  Calendar method. This is keeping track of the length of each menstrual cycle and identifying when you are fertile.  Ovulation method. This is avoiding sexual intercourse during ovulation.  Symptothermal method. This is avoiding sexual intercourse during ovulation, using a thermometer and ovulation symptoms.  Post-ovulation method. This is timing sexual intercourse after you have ovulated. Regardless of which type or method of contraception you choose, it is important that you use condoms to protect against the transmission of sexually transmitted infections (STIs). Talk with your health care provider about which form of contraception is most appropriate for you. This information is not  intended to replace advice given to you by your health care provider. Make sure you discuss any questions you have with your health care provider. Document Released: 06/01/2005 Document Revised: 11/07/2015 Document Reviewed: 11/24/2012 Elsevier Interactive Patient Education  2017 Elsevier Inc.  

## 2017-05-31 NOTE — Progress Notes (Signed)
Patient reports good fetal movement, denies pain. 

## 2017-06-01 LAB — COMPREHENSIVE METABOLIC PANEL
ALT: 8 IU/L (ref 0–32)
AST: 14 IU/L (ref 0–40)
Albumin/Globulin Ratio: 1.5 (ref 1.2–2.2)
Albumin: 3.7 g/dL (ref 3.5–5.5)
Alkaline Phosphatase: 133 IU/L — ABNORMAL HIGH (ref 39–117)
BUN/Creatinine Ratio: 6 — ABNORMAL LOW (ref 9–23)
BUN: 3 mg/dL — AB (ref 6–20)
Bilirubin Total: 0.3 mg/dL (ref 0.0–1.2)
CALCIUM: 8.9 mg/dL (ref 8.7–10.2)
CO2: 22 mmol/L (ref 20–29)
CREATININE: 0.48 mg/dL — AB (ref 0.57–1.00)
Chloride: 106 mmol/L (ref 96–106)
GFR calc Af Amer: 163 mL/min/{1.73_m2} (ref >59)
GFR, EST NON AFRICAN AMERICAN: 142 mL/min/{1.73_m2} (ref >59)
GLOBULIN, TOTAL: 2.4 g/dL (ref 1.5–4.5)
Glucose: 85 mg/dL (ref 65–99)
Potassium: 4.1 mmol/L (ref 3.5–5.2)
Sodium: 140 mmol/L (ref 134–144)
Total Protein: 6.1 g/dL (ref 6.0–8.5)

## 2017-06-01 LAB — PROTEIN / CREATININE RATIO, URINE
Creatinine, Urine: 87.6 mg/dL
Protein, Ur: 22 mg/dL
Protein/Creat Ratio: 251 mg/g creat — ABNORMAL HIGH (ref 0–200)

## 2017-06-01 LAB — CERVICOVAGINAL ANCILLARY ONLY
Bacterial vaginitis: POSITIVE — AB
Candida vaginitis: POSITIVE — AB
Chlamydia: NEGATIVE
NEISSERIA GONORRHEA: NEGATIVE
TRICH (WINDOWPATH): NEGATIVE

## 2017-06-01 LAB — CBC
HEMATOCRIT: 33.8 % — AB (ref 34.0–46.6)
HEMOGLOBIN: 11.1 g/dL (ref 11.1–15.9)
MCH: 27.3 pg (ref 26.6–33.0)
MCHC: 32.8 g/dL (ref 31.5–35.7)
MCV: 83 fL (ref 79–97)
Platelets: 195 10*3/uL (ref 150–379)
RBC: 4.07 x10E6/uL (ref 3.77–5.28)
RDW: 15.1 % (ref 12.3–15.4)
WBC: 7 10*3/uL (ref 3.4–10.8)

## 2017-06-02 LAB — STREP GP B NAA: Strep Gp B NAA: POSITIVE — AB

## 2017-06-03 ENCOUNTER — Other Ambulatory Visit: Payer: Self-pay | Admitting: Certified Nurse Midwife

## 2017-06-03 DIAGNOSIS — B3731 Acute candidiasis of vulva and vagina: Secondary | ICD-10-CM

## 2017-06-03 DIAGNOSIS — B373 Candidiasis of vulva and vagina: Secondary | ICD-10-CM

## 2017-06-03 DIAGNOSIS — N760 Acute vaginitis: Secondary | ICD-10-CM

## 2017-06-03 DIAGNOSIS — Z3400 Encounter for supervision of normal first pregnancy, unspecified trimester: Secondary | ICD-10-CM

## 2017-06-03 DIAGNOSIS — B951 Streptococcus, group B, as the cause of diseases classified elsewhere: Secondary | ICD-10-CM

## 2017-06-03 DIAGNOSIS — B9689 Other specified bacterial agents as the cause of diseases classified elsewhere: Secondary | ICD-10-CM

## 2017-06-03 MED ORDER — FLUCONAZOLE 150 MG PO TABS: 150.0000 mg | ORAL_TABLET | Freq: Once | ORAL | 0 refills | Status: AC

## 2017-06-03 MED ORDER — SECNIDAZOLE 2 G PO PACK: 1.0000 | PACK | Freq: Once | ORAL | 0 refills | Status: AC

## 2017-06-03 MED ORDER — TERCONAZOLE 0.8 % VA CREA: 1.0000 | TOPICAL_CREAM | Freq: Every day | VAGINAL | 0 refills | Status: DC

## 2017-06-09 ENCOUNTER — Encounter: Payer: Medicaid Other | Admitting: Obstetrics

## 2017-06-10 ENCOUNTER — Other Ambulatory Visit: Payer: Self-pay

## 2017-06-10 ENCOUNTER — Inpatient Hospital Stay (HOSPITAL_COMMUNITY)
Admission: AD | Admit: 2017-06-10 | Discharge: 2017-06-10 | Disposition: A | Discharge status: Home / Self Care | Payer: Medicaid Other | Source: Ambulatory Visit | Attending: Obstetrics & Gynecology | Admitting: Obstetrics & Gynecology

## 2017-06-10 ENCOUNTER — Ambulatory Visit (INDEPENDENT_AMBULATORY_CARE_PROVIDER_SITE_OTHER): Payer: Medicaid Other

## 2017-06-10 ENCOUNTER — Encounter (HOSPITAL_COMMUNITY): Payer: Self-pay

## 2017-06-10 VITALS — BP 163/83 | HR 97 | Wt 251.6 lb

## 2017-06-10 DIAGNOSIS — I10 Essential (primary) hypertension: Secondary | ICD-10-CM | POA: Diagnosis present

## 2017-06-10 DIAGNOSIS — Z3A36 36 weeks gestation of pregnancy: Secondary | ICD-10-CM | POA: Insufficient documentation

## 2017-06-10 DIAGNOSIS — Z3400 Encounter for supervision of normal first pregnancy, unspecified trimester: Secondary | ICD-10-CM

## 2017-06-10 DIAGNOSIS — O26893 Other specified pregnancy related conditions, third trimester: Secondary | ICD-10-CM | POA: Diagnosis not present

## 2017-06-10 DIAGNOSIS — O133 Gestational [pregnancy-induced] hypertension without significant proteinuria, third trimester: Secondary | ICD-10-CM

## 2017-06-10 DIAGNOSIS — R030 Elevated blood-pressure reading, without diagnosis of hypertension: Secondary | ICD-10-CM | POA: Diagnosis not present

## 2017-06-10 LAB — COMPREHENSIVE METABOLIC PANEL
ALBUMIN: 3.1 g/dL — AB (ref 3.5–5.0)
ALT: 11 U/L — AB (ref 14–54)
AST: 17 U/L (ref 15–41)
Alkaline Phosphatase: 142 U/L — ABNORMAL HIGH (ref 38–126)
Anion gap: 11 (ref 5–15)
CHLORIDE: 105 mmol/L (ref 101–111)
CO2: 21 mmol/L — ABNORMAL LOW (ref 22–32)
CREATININE: 0.51 mg/dL (ref 0.44–1.00)
Calcium: 8.8 mg/dL — ABNORMAL LOW (ref 8.9–10.3)
GFR calc Af Amer: >60 mL/min (ref >60)
GFR calc non Af Amer: >60 mL/min (ref >60)
GLUCOSE: 107 mg/dL — AB (ref 65–99)
POTASSIUM: 3.7 mmol/L (ref 3.5–5.1)
Sodium: 137 mmol/L (ref 135–145)
Total Bilirubin: 0.3 mg/dL (ref 0.3–1.2)
Total Protein: 6.3 g/dL — ABNORMAL LOW (ref 6.5–8.1)

## 2017-06-10 LAB — PROTEIN / CREATININE RATIO, URINE
Creatinine, Urine: 183 mg/dL
Protein Creatinine Ratio: 0.1 mg/mg{Cre} (ref 0.00–0.15)
Total Protein, Urine: 19 mg/dL

## 2017-06-10 LAB — CBC
HEMATOCRIT: 33.5 % — AB (ref 36.0–46.0)
Hemoglobin: 10.9 g/dL — ABNORMAL LOW (ref 12.0–15.0)
MCH: 26.5 pg (ref 26.0–34.0)
MCHC: 32.5 g/dL (ref 30.0–36.0)
MCV: 81.5 fL (ref 78.0–100.0)
PLATELETS: 199 10*3/uL (ref 150–400)
RBC: 4.11 MIL/uL (ref 3.87–5.11)
RDW: 14.9 % (ref 11.5–15.5)
WBC: 6.8 10*3/uL (ref 4.0–10.5)

## 2017-06-10 NOTE — MAU Note (Signed)
Pt sent from office for bp.

## 2017-06-10 NOTE — Progress Notes (Signed)
   PRENATAL VISIT NOTE  Subjective:  Audrey Butler is a 21 y.o. G1P0 at 6234w4d being seen today for ongoing prenatal care.  She is currently monitored for the following issues for this low-risk pregnancy and has Supervision of normal first pregnancy, antepartum and Positive GBS test on their problem list.  Patient reports no complaints. Denies any headache, visual changes, or epigastric pain.  Contractions: Not present. Vag. Bleeding: None.  Movement: Present. Denies leaking of fluid.   The following portions of the patient's history were reviewed and updated as appropriate: allergies, current medications, past family history, past medical history, past social history, past surgical history and problem list. Problem list updated.  Objective:   Vitals:   06/10/17 1453  BP: (!) 163/83  Pulse: 97  Weight: 251 lb 9.6 oz (114.1 kg)    Fetal Status: Fetal Heart Rate (bpm): 151 Fundal Height: 37 cm Movement: Present     General:  Alert, oriented and cooperative. Patient is in no acute distress.  Skin: Skin is warm and dry. No rash noted.   Cardiovascular: Normal heart rate noted  Respiratory: Normal respiratory effort, no problems with respiration noted  Abdomen: Soft, gravid, appropriate for gestational age.  Pain/Pressure: Absent     Pelvic: Cervical exam deferred        Extremities: Normal range of motion.  Edema: Trace  Mental Status:  Normal mood and affect. Normal behavior. Normal judgment and thought content.   Assessment and Plan:  Pregnancy: G1P0 at 7134w4d  1. Supervision of normal first pregnancy, antepartum -GBS positive, reviewed with patient and will treat in labor  2. Gestational hypertension, third trimester -Severe range blood pressure today and repeat still elevated.  -Sent to MAU for preeclampsia labs. Will need induction at 37 weeks.   Preterm labor symptoms and general obstetric precautions including but not limited to vaginal bleeding, contractions, leaking of  fluid and fetal movement were reviewed in detail with the patient. Please refer to After Visit Summary for other counseling recommendations.   Rolm BookbinderCaroline M Neill, CNM 06/10/17 3:18 PM

## 2017-06-10 NOTE — MAU Provider Note (Signed)
History     CSN: [id number]  Arrival date and time: 06/10/17 1526   First Provider Initiated Contact with Patient 06/10/17 1626      Chief Complaint  Patient presents with  . Hypertension   HPI Maricela Boudreana Schweigert is a 21 y.o. G1P0 at 608w4d who presents from the office for BP evaluation. Was seen in office today for ROB & had elevated BP. Per notes, patient being followed for elevated BPs & will needs IOL at 37 wks. Pt denies history of hypertension. Denies headache, visual disturbance, or epigastric pain. Positive fetal movement.  OB History    Gravida Para Term Preterm AB Living   1             SAB TAB Ectopic Multiple Live Births                  Past Medical History:  Diagnosis Date  . Medical history non-contributory     Past Surgical History:  Procedure Laterality Date  . NO PAST SURGERIES      History reviewed. No pertinent family history.  Social History   Tobacco Use  . Smoking status: Never Smoker  . Smokeless tobacco: Never Used  Substance Use Topics  . Alcohol use: No  . Drug use: No    Allergies: No Known Allergies  Medications Prior to Admission  Medication Sig Dispense Refill Last Dose  . Prenat-FeFum-FePo-FA-Omega 3 (CONCEPT DHA) 53.5-38-1 MG CAPS Take 1 tablet by mouth daily. 30 capsule 12 06/09/2017 at Unknown time  . Prenatal-DSS-FeCb-FeGl-FA (CITRANATAL BLOOM) 90-1 MG TABS Take 1 tablet by mouth daily. 30 tablet 12 Taking    Review of Systems  Constitutional: Negative.   Eyes: Negative for visual disturbance.  Gastrointestinal: Negative.   Neurological: Negative for headaches.   Physical Exam   Blood pressure 137/82, pulse 89, temperature 98.4 F (36.9 C), temperature source Oral, resp. rate 20, height 5\' 11"  (1.803 m), weight 251 lb 12 oz (114.2 kg), last menstrual period 11/08/2016, SpO2 99 %.  Patient Vitals for the past 24 hrs:  BP Temp Temp src Pulse Resp SpO2 Height Weight  06/10/17 1726 135/76 - - 76 - - - -  06/10/17 1648  135/76 - - 90 - 100 % - -  06/10/17 1630 133/82 - - 88 - - - -  06/10/17 1615 137/82 - - 89 - - - -  06/10/17 1608 (!) 142/89 - - 93 - 99 % - -  06/10/17 1552 140/89 98.4 F (36.9 C) Oral 100 20 - 5\' 11"  (1.803 m) 251 lb 12 oz (114.2 kg)     Physical Exam  Nursing note and vitals reviewed. Constitutional: She is oriented to person, place, and time. She appears well-developed and well-nourished. No distress.  HENT:  Head: Normocephalic and atraumatic.  Eyes: Conjunctivae are normal. Right eye exhibits no discharge. Left eye exhibits no discharge. No scleral icterus.  Neck: Normal range of motion.  Cardiovascular: Normal rate, regular rhythm and normal heart sounds.  No murmur heard. Respiratory: Effort normal and breath sounds normal. No respiratory distress. She has no wheezes.  GI: Soft. There is no tenderness.  Neurological: She is alert and oriented to person, place, and time. She has normal reflexes.  No clonus  Skin: Skin is warm and dry. She is not diaphoretic.  Psychiatric: She has a normal mood and affect. Her behavior is normal. Judgment and thought content normal.    MAU Course  Procedures Results for orders placed or performed during  the hospital encounter of 06/10/17 (from the past 24 hour(s))  Protein / creatinine ratio, urine     Status: None   Collection Time: 06/10/17  3:37 PM  Result Value Ref Range   Creatinine, Urine 183.00 mg/dL   Total Protein, Urine 19 mg/dL   Protein Creatinine Ratio 0.10 0.00 - 0.15 mg/mg[Cre]  CBC     Status: Abnormal   Collection Time: 06/10/17  3:57 PM  Result Value Ref Range   WBC 6.8 4.0 - 10.5 K/uL   RBC 4.11 3.87 - 5.11 MIL/uL   Hemoglobin 10.9 (L) 12.0 - 15.0 g/dL   HCT 16.133.5 (L) 09.636.0 - 04.546.0 %   MCV 81.5 78.0 - 100.0 fL   MCH 26.5 26.0 - 34.0 pg   MCHC 32.5 30.0 - 36.0 g/dL   RDW 40.914.9 81.111.5 - 91.415.5 %   Platelets 199 150 - 400 K/uL  Comprehensive metabolic panel     Status: Abnormal   Collection Time: 06/10/17  3:57 PM   Result Value Ref Range   Sodium 137 135 - 145 mmol/L   Potassium 3.7 3.5 - 5.1 mmol/L   Chloride 105 101 - 111 mmol/L   CO2 21 (L) 22 - 32 mmol/L   Glucose, Bld 107 (H) 65 - 99 mg/dL   BUN <5 (L) 6 - 20 mg/dL   Creatinine, Ser 7.820.51 0.44 - 1.00 mg/dL   Calcium 8.8 (L) 8.9 - 10.3 mg/dL   Total Protein 6.3 (L) 6.5 - 8.1 g/dL   Albumin 3.1 (L) 3.5 - 5.0 g/dL   AST 17 15 - 41 U/L   ALT 11 (L) 14 - 54 U/L   Alkaline Phosphatase 142 (H) 38 - 126 U/L   Total Bilirubin 0.3 0.3 - 1.2 mg/dL   GFR calc non Af Amer >60 >60 mL/min   GFR calc Af Amer >60 >60 mL/min   Anion gap 11 5 - 15    MDM NST:  Baseline: 145 bpm, Variability: Good {> 6 bpm), Accelerations: Reactive and Decelerations: Absent Elevated BP x 2, none severe range. Pt asymptomatic with normal labs.  Reviewed chart with Dr. Adrian BlackwaterStinson. As patient doesn't have documented elevated BPs >4 hours apart, [facility] & manage accordingly.  Assessment and Plan  A; 1. Elevated BP without diagnosis of hypertension   2. [redacted] weeks gestation of pregnancy    P: Discharge home PreE return precautions Go to [facility] tomorrow morning for BP check   Judeth Hornrin Aayla Marrocco 06/10/2017, 4:27 PM

## 2017-06-10 NOTE — Patient Instructions (Signed)
Preeclampsia and Eclampsia °Preeclampsia is a serious condition that develops only during pregnancy. It is also called toxemia of pregnancy. This condition causes high blood pressure along with other symptoms, such as swelling and headaches. These symptoms may develop as the condition gets worse. Preeclampsia may occur at 20 weeks of pregnancy or later. °Diagnosing and treating preeclampsia early is very important. If not treated early, it can cause serious problems for you and your baby. One problem it can lead to is eclampsia, which is a condition that causes muscle jerking or shaking (convulsions or seizures) in the mother. Delivering your baby is the best treatment for preeclampsia or eclampsia. Preeclampsia and eclampsia symptoms usually go away after your baby is born. °What are the causes? °The cause of preeclampsia is not known. °What increases the risk? °The following risk factors make you more likely to develop preeclampsia: °· Being pregnant for the first time. °· Having had preeclampsia during a past pregnancy. °· Having a family history of preeclampsia. °· Having high blood pressure. °· Being pregnant with twins or triplets. °· Being 35 or older. °· Being African-American. °· Having kidney disease or diabetes. °· Having medical conditions such as lupus or blood diseases. °· Being very overweight (obese). ° °What are the signs or symptoms? °The earliest signs of preeclampsia are: °· High blood pressure. °· Increased protein in your urine. Your health care provider will check for this at every visit before you give birth (prenatal visit). ° °Other symptoms that may develop as the condition gets worse include: °· Severe headaches. °· Sudden weight gain. °· Swelling of the hands, face, legs, and feet. °· Nausea and vomiting. °· Vision problems, such as blurred or double vision. °· Numbness in the face, arms, legs, and feet. °· Urinating less than usual. °· Dizziness. °· Slurred speech. °· Abdominal pain,  especially upper abdominal pain. °· Convulsions or seizures. ° °Symptoms generally go away after giving birth. °How is this diagnosed? °There are no screening tests for preeclampsia. Your health care provider will ask you about symptoms and check for signs of preeclampsia during your prenatal visits. You may also have tests that include: °· Urine tests. °· Blood tests. °· Checking your blood pressure. °· Monitoring your baby’s heart rate. °· Ultrasound. ° °How is this treated? °You and your health care provider will determine the treatment approach that is best for you. Treatment may include: °· Having more frequent prenatal exams to check for signs of preeclampsia, if you have an increased risk for preeclampsia. °· Bed rest. °· Reducing how much salt (sodium) you eat. °· Medicine to lower your blood pressure. °· Staying in the hospital, if your condition is severe. There, treatment will focus on controlling your blood pressure and the amount of fluids in your body (fluid retention). °· You may need to take medicine (magnesium sulfate) to prevent seizures. This medicine may be given as an injection or through an IV tube. °· Delivering your baby early, if your condition gets worse. You may have your labor started with medicine (induced), or you may have a cesarean delivery. ° °Follow these instructions at home: °Eating and drinking ° °· Drink enough fluid to keep your urine clear or pale yellow. °· Eat a healthy diet that is low in sodium. Do not add salt to your food. Check nutrition labels to see how much sodium a food or beverage contains. °· Avoid caffeine. °Lifestyle °· Do not use any products that contain nicotine or tobacco, such as cigarettes   and e-cigarettes. If you need help quitting, ask your health care provider. °· Do not use alcohol or drugs. °· Avoid stress as much as possible. Rest and get plenty of sleep. °General instructions °· Take over-the-counter and prescription medicines only as told by your  health care provider. °· When lying down, lie on your side. This keeps pressure off of your baby. °· When sitting or lying down, raise (elevate) your feet. Try putting some pillows underneath your lower legs. °· Exercise regularly. Ask your health care provider what kinds of exercise are best for you. °· Keep all follow-up and prenatal visits as told by your health care provider. This is important. °How is this prevented? °To prevent preeclampsia or eclampsia from developing during another pregnancy: °· Get proper medical care during pregnancy. Your health care provider may be able to prevent preeclampsia or diagnose and treat it early. °· Your health care provider may have you take a low-dose aspirin or a calcium supplement during your next pregnancy. °· You may have tests of your blood pressure and kidney function after giving birth. °· Maintain a healthy weight. Ask your health care provider for help managing weight gain during pregnancy. °· Work with your health care provider to manage any long-term (chronic) health conditions you have, such as diabetes or kidney problems. ° °Contact a health care provider if: °· You gain more weight than expected. °· You have headaches. °· You have nausea or vomiting. °· You have abdominal pain. °· You feel dizzy or light-headed. °Get help right away if: °· You develop sudden or severe swelling anywhere in your body. This usually happens in the legs. °· You gain 5 lbs (2.3 kg) or more during one week. °· You have severe: °? Abdominal pain. °? Headaches. °? Dizziness. °? Vision problems. °? Confusion. °? Nausea or vomiting. °· You have a seizure. °· You have trouble moving any part of your body. °· You develop numbness in any part of your body. °· You have trouble speaking. °· You have any abnormal bleeding. °· You pass out. °This information is not intended to replace advice given to you by your health care provider. Make sure you discuss any questions you have with your health  care provider. °Document Released: 05/29/2000 Document Revised: 01/28/2016 Document Reviewed: 01/06/2016 °Elsevier Interactive Patient Education © 2018 Elsevier Inc. ° °

## 2017-06-10 NOTE — Discharge Instructions (Signed)
Hypertension During Pregnancy °Hypertension, commonly called high blood pressure, is when the force of blood pumping through your arteries is too strong. Arteries are blood vessels that carry blood from the heart throughout the body. Hypertension during pregnancy can cause problems for you and your baby. Your baby may be born early (prematurely) or may not weigh as much as he or she should at birth. Very bad cases of hypertension during pregnancy can be life-threatening. °Different types of hypertension can occur during pregnancy. These include: °· Chronic hypertension. This happens when: °? You have hypertension before pregnancy and it continues during pregnancy. °? You develop hypertension before you are [redacted] weeks pregnant, and it continues during pregnancy. °· Gestational hypertension. This is hypertension that develops after the 20th week of pregnancy. °· Preeclampsia, also called toxemia of pregnancy. This is a very serious type of hypertension that develops only during pregnancy. It affects the whole body, and it can be very dangerous for you and your baby. ° °Gestational hypertension and preeclampsia usually go away within 6 weeks after your baby is born. Women who have hypertension during pregnancy have a greater chance of developing hypertension later in life or during future pregnancies. °What are the causes? °The exact cause of hypertension is not known. °What increases the risk? °There are certain factors that make it more likely for you to develop hypertension during pregnancy. These include: °· Having hypertension during a previous pregnancy or prior to pregnancy. °· Being overweight. °· Being older than age 40. °· Being pregnant for the first time or being pregnant with more than one baby. °· Becoming pregnant using fertilization methods such as IVF (in vitro fertilization). °· Having diabetes, kidney problems, or systemic lupus erythematosus. °· Having a family history of hypertension. ° °What are the  signs or symptoms? °Chronic hypertension and gestational hypertension rarely cause symptoms. Preeclampsia causes symptoms, which may include: °· Increased protein in your urine. Your health care provider will check for this at every visit before you give birth (prenatal visit). °· Severe headaches. °· Sudden weight gain. °· Swelling of the hands, face, legs, and feet. °· Nausea and vomiting. °· Vision problems, such as blurred or double vision. °· Numbness in the face, arms, legs, and feet. °· Dizziness. °· Slurred speech. °· Sensitivity to bright lights. °· Abdominal pain. °· Convulsions. ° °How is this diagnosed? °You may be diagnosed with hypertension during a routine prenatal exam. At each prenatal visit, you may: °· Have a urine test to check for high amounts of protein in your urine. °· Have your blood pressure checked. A blood pressure reading is recorded as two numbers, such as "120 over 80" (or 120/80). The first ("top") number is called the systolic pressure. It is a measure of the pressure in your arteries when your heart beats. The second ("bottom") number is called the diastolic pressure. It is a measure of the pressure in your arteries as your heart relaxes between beats. Blood pressure is measured in a unit called mm Hg. A normal blood pressure reading is: °? Systolic: below 120. °? Diastolic: below 80. ° °The type of hypertension that you are diagnosed with depends on your test results and when your symptoms developed. °· Chronic hypertension is usually diagnosed before 20 weeks of pregnancy. °· Gestational hypertension is usually diagnosed after 20 weeks of pregnancy. °· Hypertension with high amounts of protein in the urine is diagnosed as preeclampsia. °· Blood pressure measurements that stay above 160 systolic, or above 110 diastolic, are   signs of severe preeclampsia. ° °How is this treated? °Treatment for hypertension during pregnancy varies depending on the type of hypertension you have and how  serious it is. °· If you take medicines called ACE inhibitors to treat chronic hypertension, you may need to switch medicines. ACE inhibitors should not be taken during pregnancy. °· If you have gestational hypertension, you may need to take blood pressure medicine. °· If you are at risk for preeclampsia, your health care provider may recommend that you take a low-dose aspirin every day to prevent high blood pressure during your pregnancy. °· If you have severe preeclampsia, you may need to be hospitalized so you and your baby can be monitored closely. You may also need to take medicine (magnesium sulfate) to prevent seizures and to lower blood pressure. This medicine may be given as an injection or through an IV tube. °· In some cases, if your condition gets worse, you may need to deliver your baby early. ° °Follow these instructions at home: °Eating and drinking °· Drink enough fluid to keep your urine clear or pale yellow. °· Eat a healthy diet that is low in salt (sodium). Do not add salt to your food. Check food labels to see how much sodium a food or beverage contains. °Lifestyle °· Do not use any products that contain nicotine or tobacco, such as cigarettes and e-cigarettes. If you need help quitting, ask your health care provider. °· Do not use alcohol. °· Avoid caffeine. °· Avoid stress as much as possible. Rest and get plenty of sleep. °General instructions °· Take over-the-counter and prescription medicines only as told by your health care provider. °· While lying down, lie on your left side. This keeps pressure off your baby. °· While sitting or lying down, raise (elevate) your feet. Try putting some pillows under your lower legs. °· Exercise regularly. Ask your health care provider what kinds of exercise are best for you. °· Keep all prenatal and follow-up visits as told by your health care provider. This is important. °Contact a health care provider if: °· You have symptoms that your health care  provider told you may require more treatment or monitoring, such as: °? Fever. °? Vomiting. °? Headache. °Get help right away if: °· You have severe abdominal pain or vomiting that does not get better with treatment. °· You suddenly develop swelling in your hands, ankles, or face. °· You gain 4 lbs (1.8 kg) or more in 1 week. °· You develop vaginal bleeding, or you have blood in your urine. °· You do not feel your baby moving as much as usual. °· You have blurred or double vision. °· You have muscle twitching or sudden tightening (spasms). °· You have shortness of breath. °· Your lips or fingernails turn blue. °This information is not intended to replace advice given to you by your health care provider. Make sure you discuss any questions you have with your health care provider. °Document Released: 02/17/2011 Document Revised: 12/20/2015 Document Reviewed: 11/15/2015 °Elsevier Interactive Patient Education © 2018 [employer] ° °

## 2017-06-11 ENCOUNTER — Inpatient Hospital Stay (HOSPITAL_COMMUNITY)
Admission: AD | Admit: 2017-06-11 | Discharge: 2017-06-11 | Disposition: A | Discharge status: Home / Self Care | Payer: Medicaid Other | Source: Ambulatory Visit | Attending: Obstetrics & Gynecology | Admitting: Obstetrics & Gynecology

## 2017-06-11 ENCOUNTER — Encounter (HOSPITAL_COMMUNITY): Payer: Self-pay | Admitting: *Deleted

## 2017-06-11 ENCOUNTER — Ambulatory Visit (INDEPENDENT_AMBULATORY_CARE_PROVIDER_SITE_OTHER): Payer: Self-pay | Admitting: Pediatrics

## 2017-06-11 ENCOUNTER — Ambulatory Visit: Payer: Medicaid Other

## 2017-06-11 VITALS — BP 131/88 | HR 116

## 2017-06-11 DIAGNOSIS — Z7681 Expectant parent(s) prebirth pediatrician visit: Secondary | ICD-10-CM

## 2017-06-11 DIAGNOSIS — O09893 Supervision of other high risk pregnancies, third trimester: Secondary | ICD-10-CM | POA: Diagnosis not present

## 2017-06-11 DIAGNOSIS — Z3A36 36 weeks gestation of pregnancy: Secondary | ICD-10-CM | POA: Insufficient documentation

## 2017-06-11 DIAGNOSIS — O163 Unspecified maternal hypertension, third trimester: Secondary | ICD-10-CM

## 2017-06-11 DIAGNOSIS — Z3400 Encounter for supervision of normal first pregnancy, unspecified trimester: Secondary | ICD-10-CM

## 2017-06-11 DIAGNOSIS — O133 Gestational [pregnancy-induced] hypertension without significant proteinuria, third trimester: Secondary | ICD-10-CM | POA: Diagnosis not present

## 2017-06-11 DIAGNOSIS — O139 Gestational [pregnancy-induced] hypertension without significant proteinuria, unspecified trimester: Secondary | ICD-10-CM | POA: Diagnosis present

## 2017-06-11 LAB — URINALYSIS, ROUTINE W REFLEX MICROSCOPIC
Bacteria, UA: NONE SEEN
Bilirubin Urine: NEGATIVE
GLUCOSE, UA: NEGATIVE mg/dL
Hgb urine dipstick: NEGATIVE
Ketones, ur: NEGATIVE mg/dL
Nitrite: NEGATIVE
PH: 7 (ref 5.0–8.0)
PROTEIN: NEGATIVE mg/dL
Specific Gravity, Urine: 1.016 (ref 1.005–1.030)

## 2017-06-11 NOTE — MAU Note (Signed)
Pt reports she was seen in MAU for elevated B/P yesterday. Told to get her B/P checked in office today. Went and it was 130's/80's. Went home and recheck B/P this evening and it was 150 systolic. C/o s;ight headache. Denies any visual disturbances.

## 2017-06-11 NOTE — Progress Notes (Addendum)
ROB presents for Nurse visit only today for B/P Check  And review labs per notes. Per pt made aware of results at hospital and have received Rx.  B/P reviewed by Dr.Arnold.

## 2017-06-11 NOTE — Discharge Instructions (Signed)

## 2017-06-11 NOTE — MAU Provider Note (Signed)
History     CSN: [id number]  Arrival date and time: 06/11/17 2102   First Provider Initiated Contact with Patient 06/11/17 2138      Chief Complaint  Patient presents with  . Hypertension   Audrey Butler is a 21 y.o. G1P0 at 35w5dwho presents today with elevated blood pressure. She reports that she has had high blood pressure at her last several visits. One will be high, and the next one will be normal (no elevated blood pressures documented). She was seen here yesterday with elevated blood pressure. Plan was for blood pressure check in the office today, and if elevated then plan for IOL. BP in office was 130/80. She is worried about her blood pressure today. She denies any VB, LOF or contractions. She reports normal fetal movement. She denies any HA, visual disturbances or RUQ pain. She has normal labs on 06/10/17.   Hypertension  This is a new problem. The current episode started 1 to 4 weeks ago. The problem is unchanged. Pertinent negatives include no headaches. Associated agents: pregnancy  Past treatments include nothing.    Past Medical History:  Diagnosis Date  . Medical history non-contributory     Past Surgical History:  Procedure Laterality Date  . NO PAST SURGERIES      History reviewed. No pertinent family history.  Social History   Tobacco Use  . Smoking status: Never Smoker  . Smokeless tobacco: Never Used  Substance Use Topics  . Alcohol use: No  . Drug use: No    Allergies: No Known Allergies  Medications Prior to Admission  Medication Sig Dispense Refill Last Dose  . Prenat-FeFum-FePo-FA-Omega 3 (CONCEPT DHA) 53.5-38-1 MG CAPS Take 1 tablet by mouth daily. 30 capsule 12 06/09/2017 at Unknown time  . Prenatal-DSS-FeCb-FeGl-FA (CITRANATAL BLOOM) 90-1 MG TABS Take 1 tablet by mouth daily. 30 tablet 12 Taking    Review of Systems  Constitutional: Negative for chills and fever.  Eyes: Negative for visual disturbance.  Gastrointestinal: Negative  for abdominal pain.  Genitourinary: Negative for dysuria, frequency, pelvic pain, vaginal bleeding and vaginal discharge.  Neurological: Negative for headaches.   Physical Exam   Blood pressure 139/78, pulse 79, last menstrual period 11/08/2016.  Physical Exam  Nursing note and vitals reviewed. Constitutional: She is oriented to person, place, and time. She appears well-developed and well-nourished. No distress.  HENT:  Head: Normocephalic.  Cardiovascular: Normal rate.  Respiratory: Effort normal.  GI: Soft. There is no tenderness. There is no rebound.  Neurological: She is alert and oriented to person, place, and time. She has normal reflexes. She exhibits normal muscle tone (no clonus ).  Skin: Skin is warm and dry.  Psychiatric: She has a normal mood and affect.   Results for orders placed or performed during the hospital encounter of 06/10/17 (from the past 48 hour(s))  Protein / creatinine ratio, urine     Status: None   Collection Time: 06/10/17  3:37 PM  Result Value Ref Range   Creatinine, Urine 183.00 mg/dL   Total Protein, Urine 19 mg/dL    Comment: NO NORMAL RANGE ESTABLISHED FOR THIS TEST   Protein Creatinine Ratio 0.10 0.00 - 0.15 mg/mg[Cre]  CBC     Status: Abnormal   Collection Time: 06/10/17  3:57 PM  Result Value Ref Range   WBC 6.8 4.0 - 10.5 K/uL   RBC 4.11 3.87 - 5.11 MIL/uL   Hemoglobin 10.9 (L) 12.0 - 15.0 g/dL   HCT 33.5 (L) 36.0 - 46.0 %  MCV 81.5 78.0 - 100.0 fL   MCH 26.5 26.0 - 34.0 pg   MCHC 32.5 30.0 - 36.0 g/dL   RDW 14.9 11.5 - 15.5 %   Platelets 199 150 - 400 K/uL  Comprehensive metabolic panel     Status: Abnormal   Collection Time: 06/10/17  3:57 PM  Result Value Ref Range   Sodium 137 135 - 145 mmol/L   Potassium 3.7 3.5 - 5.1 mmol/L   Chloride 105 101 - 111 mmol/L   CO2 21 (L) 22 - 32 mmol/L   Glucose, Bld 107 (H) 65 - 99 mg/dL   BUN <5 (L) 6 - 20 mg/dL    Comment: REPEATED TO VERIFY   Creatinine, Ser 0.51 0.44 - 1.00 mg/dL    Calcium 8.8 (L) 8.9 - 10.3 mg/dL   Total Protein 6.3 (L) 6.5 - 8.1 g/dL   Albumin 3.1 (L) 3.5 - 5.0 g/dL   AST 17 15 - 41 U/L   ALT 11 (L) 14 - 54 U/L   Alkaline Phosphatase 142 (H) 38 - 126 U/L   Total Bilirubin 0.3 0.3 - 1.2 mg/dL   GFR calc non Af Amer >60 >60 mL/min   GFR calc Af Amer >60 >60 mL/min    Comment: (NOTE) The eGFR has been calculated using the CKD EPI equation. This calculation has not been validated in all clinical situations. eGFR's persistently <60 mL/min signify possible Chronic Kidney Disease.    Anion gap 11 5 - 15   FHT: 145 , moderate with 15x15 accels, no decels Toco: some UI.  MAU Course  Procedures  MDM DW patient that now she meets criteria for GHTN with elevated blood pressure more than 4 hours apart. IOL scheduled for 12/30 at 7:30.   Assessment and Plan   1. Gestational hypertension, third trimester   2. Supervision of normal first pregnancy, antepartum   3. [redacted] weeks gestation of pregnancy    DC home Pre-eclampsia warning signs   3rd Trimester precautions  PTL precautions  Fetal kick counts RX: none  Return to MAU as needed FU with OB as planned  [facility] Follow up.   Why:  Induction of labor scheduled for Sunday 06/13/17 at 7:30 am Contact information: [place] [id number] [place] 06/11/2017, 9:40 PM

## 2017-06-11 NOTE — Progress Notes (Signed)
Prenatal counseling for impending newborn done. Prenatal care began at [redacted] weeks gestation. Mother being supervised for gestational hypertension. No other complications.

## 2017-06-13 ENCOUNTER — Inpatient Hospital Stay (HOSPITAL_COMMUNITY): Payer: Medicaid Other | Admitting: Anesthesiology

## 2017-06-13 ENCOUNTER — Encounter (HOSPITAL_COMMUNITY): Payer: Self-pay

## 2017-06-13 ENCOUNTER — Inpatient Hospital Stay (HOSPITAL_COMMUNITY)
Admit: 2017-06-13 | Discharge: 2017-06-15 | DRG: 807 | Disposition: A | Discharge status: Home / Self Care | Payer: Medicaid Other | Source: Ambulatory Visit | Attending: Obstetrics & Gynecology | Admitting: Obstetrics & Gynecology

## 2017-06-13 DIAGNOSIS — O9902 Anemia complicating childbirth: Secondary | ICD-10-CM | POA: Diagnosis present

## 2017-06-13 DIAGNOSIS — O133 Gestational [pregnancy-induced] hypertension without significant proteinuria, third trimester: Secondary | ICD-10-CM

## 2017-06-13 DIAGNOSIS — O99824 Streptococcus B carrier state complicating childbirth: Secondary | ICD-10-CM | POA: Diagnosis present

## 2017-06-13 DIAGNOSIS — B951 Streptococcus, group B, as the cause of diseases classified elsewhere: Secondary | ICD-10-CM | POA: Diagnosis present

## 2017-06-13 DIAGNOSIS — O134 Gestational [pregnancy-induced] hypertension without significant proteinuria, complicating childbirth: Secondary | ICD-10-CM | POA: Diagnosis present

## 2017-06-13 DIAGNOSIS — D649 Anemia, unspecified: Secondary | ICD-10-CM | POA: Diagnosis present

## 2017-06-13 DIAGNOSIS — O139 Gestational [pregnancy-induced] hypertension without significant proteinuria, unspecified trimester: Secondary | ICD-10-CM | POA: Diagnosis present

## 2017-06-13 DIAGNOSIS — Z3A37 37 weeks gestation of pregnancy: Secondary | ICD-10-CM

## 2017-06-13 DIAGNOSIS — Z3400 Encounter for supervision of normal first pregnancy, unspecified trimester: Secondary | ICD-10-CM

## 2017-06-13 LAB — COMPREHENSIVE METABOLIC PANEL
ALBUMIN: 3 g/dL — AB (ref 3.5–5.0)
ALK PHOS: 143 U/L — AB (ref 38–126)
ALT: 11 U/L — ABNORMAL LOW (ref 14–54)
AST: 20 U/L (ref 15–41)
Anion gap: 10 (ref 5–15)
BILIRUBIN TOTAL: 0.9 mg/dL (ref 0.3–1.2)
CO2: 21 mmol/L — AB (ref 22–32)
Calcium: 8.7 mg/dL — ABNORMAL LOW (ref 8.9–10.3)
Chloride: 106 mmol/L (ref 101–111)
Creatinine, Ser: 0.47 mg/dL (ref 0.44–1.00)
GFR calc Af Amer: >60 mL/min (ref >60)
GFR calc non Af Amer: >60 mL/min (ref >60)
GLUCOSE: 120 mg/dL — AB (ref 65–99)
POTASSIUM: 3.5 mmol/L (ref 3.5–5.1)
SODIUM: 137 mmol/L (ref 135–145)
TOTAL PROTEIN: 5.8 g/dL — AB (ref 6.5–8.1)

## 2017-06-13 LAB — CBC
HCT: 33.5 % — ABNORMAL LOW (ref 36.0–46.0)
Hemoglobin: 10.8 g/dL — ABNORMAL LOW (ref 12.0–15.0)
MCH: 26.3 pg (ref 26.0–34.0)
MCHC: 32.2 g/dL (ref 30.0–36.0)
MCV: 81.7 fL (ref 78.0–100.0)
Platelets: 213 10*3/uL (ref 150–400)
RBC: 4.1 MIL/uL (ref 3.87–5.11)
RDW: 14.8 % (ref 11.5–15.5)
WBC: 7.2 10*3/uL (ref 4.0–10.5)

## 2017-06-13 LAB — PROTEIN / CREATININE RATIO, URINE
Creatinine, Urine: 81 mg/dL
Protein Creatinine Ratio: 0.11 mg/mg{Cre} (ref 0.00–0.15)
TOTAL PROTEIN, URINE: 9 mg/dL

## 2017-06-13 LAB — RPR: RPR Ser Ql: NONREACTIVE

## 2017-06-13 LAB — TYPE AND SCREEN
ABO/RH(D): O POS
ANTIBODY SCREEN: NEGATIVE

## 2017-06-13 LAB — ABO/RH: ABO/RH(D): O POS

## 2017-06-13 MED ORDER — LACTATED RINGERS IV SOLN
INTRAVENOUS | Status: DC
  Administered 2017-06-13 (×2): via INTRAVENOUS

## 2017-06-13 MED ORDER — MISOPROSTOL 50MCG HALF TABLET: 50.0000 ug | ORAL_TABLET | ORAL | Status: DC | PRN

## 2017-06-13 MED ORDER — FENTANYL 2.5 MCG/ML BUPIVACAINE 1/10 % EPIDURAL INFUSION (WH - ANES)
14.0000 mL/h | INTRAMUSCULAR | Status: DC | PRN
Start: 2017-06-13 — End: 2017-06-13
  Administered 2017-06-13: 14 mL/h via EPIDURAL

## 2017-06-13 MED ORDER — COCONUT OIL OIL: 1.0000 "application " | TOPICAL_OIL | Status: DC | PRN

## 2017-06-13 MED ORDER — DIBUCAINE 1 % RE OINT: 1.0000 "application " | TOPICAL_OINTMENT | RECTAL | Status: DC | PRN

## 2017-06-13 MED ORDER — EPHEDRINE 5 MG/ML INJ
10.0000 mg | INTRAVENOUS | Status: DC | PRN
  Filled 2017-06-13: qty 2

## 2017-06-13 MED ORDER — TETANUS-DIPHTH-ACELL PERTUSSIS 5-2.5-18.5 LF-MCG/0.5 IM SUSP: 0.5000 mL | Freq: Once | INTRAMUSCULAR | Status: DC

## 2017-06-13 MED ORDER — LACTATED RINGERS IV SOLN: 500.0000 mL | Freq: Once | INTRAVENOUS | Status: DC

## 2017-06-13 MED ORDER — PHENYLEPHRINE 40 MCG/ML (10ML) SYRINGE FOR IV PUSH (FOR BLOOD PRESSURE SUPPORT)
PREFILLED_SYRINGE | INTRAVENOUS | Status: AC
  Filled 2017-06-13: qty 20

## 2017-06-13 MED ORDER — OXYCODONE-ACETAMINOPHEN 5-325 MG PO TABS: 1.0000 | ORAL_TABLET | ORAL | Status: DC | PRN

## 2017-06-13 MED ORDER — IBUPROFEN 600 MG PO TABS
600.0000 mg | ORAL_TABLET | Freq: Four times a day (QID) | ORAL | Status: DC
  Administered 2017-06-14 (×2): 600 mg via ORAL
  Filled 2017-06-13: qty 1

## 2017-06-13 MED ORDER — ACETAMINOPHEN 325 MG PO TABS: 650.0000 mg | ORAL_TABLET | ORAL | Status: DC | PRN

## 2017-06-13 MED ORDER — IBUPROFEN 600 MG PO TABS
600.0000 mg | ORAL_TABLET | Freq: Four times a day (QID) | ORAL | Status: DC
  Administered 2017-06-14 – 2017-06-15 (×5): 600 mg via ORAL
  Filled 2017-06-13 (×6): qty 1

## 2017-06-13 MED ORDER — SODIUM CHLORIDE 0.9 % IV SOLN
1.0000 g | INTRAVENOUS | Status: DC
  Administered 2017-06-13: 1 g via INTRAVENOUS
  Filled 2017-06-13 (×4): qty 1000

## 2017-06-13 MED ORDER — DIPHENHYDRAMINE HCL 25 MG PO CAPS: 25.0000 mg | ORAL_CAPSULE | Freq: Four times a day (QID) | ORAL | Status: DC | PRN

## 2017-06-13 MED ORDER — MEASLES, MUMPS & RUBELLA VAC ~~LOC~~ INJ
0.5000 mL | INJECTION | Freq: Once | SUBCUTANEOUS | Status: DC
  Filled 2017-06-13: qty 0.5

## 2017-06-13 MED ORDER — ONDANSETRON HCL 4 MG/2ML IJ SOLN: 4.0000 mg | INTRAMUSCULAR | Status: DC | PRN

## 2017-06-13 MED ORDER — SENNOSIDES-DOCUSATE SODIUM 8.6-50 MG PO TABS
2.0000 | ORAL_TABLET | ORAL | Status: DC
  Filled 2017-06-13: qty 2

## 2017-06-13 MED ORDER — SIMETHICONE 80 MG PO CHEW: 80.0000 mg | CHEWABLE_TABLET | ORAL | Status: DC | PRN

## 2017-06-13 MED ORDER — NALBUPHINE HCL 10 MG/ML IJ SOLN
5.0000 mg | INTRAMUSCULAR | Status: DC | PRN
Start: 2017-06-13 — End: 2017-06-13

## 2017-06-13 MED ORDER — LIDOCAINE HCL (PF) 1 % IJ SOLN
30.0000 mL | INTRAMUSCULAR | Status: DC | PRN
  Filled 2017-06-13: qty 30

## 2017-06-13 MED ORDER — PHENYLEPHRINE 40 MCG/ML (10ML) SYRINGE FOR IV PUSH (FOR BLOOD PRESSURE SUPPORT)
80.0000 ug | PREFILLED_SYRINGE | INTRAVENOUS | Status: DC | PRN
  Filled 2017-06-13: qty 5

## 2017-06-13 MED ORDER — ONDANSETRON HCL 4 MG/2ML IJ SOLN: 4.0000 mg | Freq: Four times a day (QID) | INTRAMUSCULAR | Status: DC | PRN

## 2017-06-13 MED ORDER — LACTATED RINGERS IV SOLN
500.0000 mL | INTRAVENOUS | Status: DC | PRN
  Administered 2017-06-13: 500 mL via INTRAVENOUS

## 2017-06-13 MED ORDER — SENNOSIDES-DOCUSATE SODIUM 8.6-50 MG PO TABS
2.0000 | ORAL_TABLET | ORAL | Status: DC
  Administered 2017-06-14 – 2017-06-15 (×2): 2 via ORAL
  Filled 2017-06-13: qty 2

## 2017-06-13 MED ORDER — PRENATAL MULTIVITAMIN CH: 1.0000 | ORAL_TABLET | Freq: Every day | ORAL | Status: DC

## 2017-06-13 MED ORDER — ONDANSETRON HCL 4 MG PO TABS: 4.0000 mg | ORAL_TABLET | ORAL | Status: DC | PRN

## 2017-06-13 MED ORDER — SODIUM CHLORIDE 0.9% FLUSH: 3.0000 mL | Freq: Two times a day (BID) | INTRAVENOUS | Status: DC

## 2017-06-13 MED ORDER — OXYTOCIN BOLUS FROM INFUSION
500.0000 mL | Freq: Once | INTRAVENOUS | Status: AC
  Administered 2017-06-13: 500 mL via INTRAVENOUS

## 2017-06-13 MED ORDER — SODIUM CHLORIDE 0.9 % IV SOLN: 250.0000 mL | INTRAVENOUS | Status: DC | PRN

## 2017-06-13 MED ORDER — WITCH HAZEL-GLYCERIN EX PADS: 1.0000 "application " | MEDICATED_PAD | CUTANEOUS | Status: DC | PRN

## 2017-06-13 MED ORDER — BENZOCAINE-MENTHOL 20-0.5 % EX AERO: 1.0000 "application " | INHALATION_SPRAY | CUTANEOUS | Status: DC | PRN

## 2017-06-13 MED ORDER — SOD CITRATE-CITRIC ACID 500-334 MG/5ML PO SOLN: 30.0000 mL | ORAL | Status: DC | PRN

## 2017-06-13 MED ORDER — TERBUTALINE SULFATE 1 MG/ML IJ SOLN
0.2500 mg | Freq: Once | INTRAMUSCULAR | Status: DC | PRN
  Filled 2017-06-13: qty 1

## 2017-06-13 MED ORDER — OXYTOCIN 40 UNITS IN LACTATED RINGERS INFUSION - SIMPLE MED
1.0000 m[IU]/min | INTRAVENOUS | Status: DC
  Administered 2017-06-13: 2 m[IU]/min via INTRAVENOUS

## 2017-06-13 MED ORDER — SODIUM CHLORIDE 0.9% FLUSH: 3.0000 mL | INTRAVENOUS | Status: DC | PRN

## 2017-06-13 MED ORDER — ZOLPIDEM TARTRATE 5 MG PO TABS
5.0000 mg | ORAL_TABLET | Freq: Every evening | ORAL | Status: DC | PRN
Start: 2017-06-13 — End: 2017-06-15

## 2017-06-13 MED ORDER — ZOLPIDEM TARTRATE 5 MG PO TABS: 5.0000 mg | ORAL_TABLET | Freq: Every evening | ORAL | Status: DC | PRN

## 2017-06-13 MED ORDER — PENICILLIN G POTASSIUM 5000000 UNITS IJ SOLR
5.0000 10*6.[IU] | Freq: Once | INTRAVENOUS | Status: DC
  Filled 2017-06-13: qty 5

## 2017-06-13 MED ORDER — OXYCODONE-ACETAMINOPHEN 5-325 MG PO TABS: 2.0000 | ORAL_TABLET | ORAL | Status: DC | PRN

## 2017-06-13 MED ORDER — SIMETHICONE 80 MG PO CHEW
80.0000 mg | CHEWABLE_TABLET | ORAL | Status: DC | PRN
Start: 2017-06-13 — End: 2017-06-15

## 2017-06-13 MED ORDER — FENTANYL 2.5 MCG/ML BUPIVACAINE 1/10 % EPIDURAL INFUSION (WH - ANES)
INTRAMUSCULAR | Status: AC
  Filled 2017-06-13: qty 100

## 2017-06-13 MED ORDER — LIDOCAINE HCL (PF) 1 % IJ SOLN
INTRAMUSCULAR | Status: DC | PRN
  Administered 2017-06-13 (×2): 4 mL via EPIDURAL

## 2017-06-13 MED ORDER — DIPHENHYDRAMINE HCL 50 MG/ML IJ SOLN: 12.5000 mg | INTRAMUSCULAR | Status: DC | PRN

## 2017-06-13 MED ORDER — PRENATAL MULTIVITAMIN CH
1.0000 | ORAL_TABLET | Freq: Every day | ORAL | Status: DC
  Administered 2017-06-14 – 2017-06-15 (×2): 1 via ORAL
  Filled 2017-06-13 (×2): qty 1

## 2017-06-13 MED ORDER — OXYTOCIN 40 UNITS IN LACTATED RINGERS INFUSION - SIMPLE MED: 1.0000 m[IU]/min | INTRAVENOUS | Status: DC

## 2017-06-13 MED ORDER — SODIUM CHLORIDE 0.9 % IV SOLN
2.0000 g | Freq: Four times a day (QID) | INTRAVENOUS | Status: DC
  Administered 2017-06-13: 2 g via INTRAVENOUS
  Filled 2017-06-13: qty 2000

## 2017-06-13 MED ORDER — OXYTOCIN 40 UNITS IN LACTATED RINGERS INFUSION - SIMPLE MED
2.5000 [IU]/h | INTRAVENOUS | Status: DC
  Filled 2017-06-13: qty 1000

## 2017-06-13 MED ORDER — PENICILLIN G POT IN DEXTROSE 60000 UNIT/ML IV SOLN
3.0000 10*6.[IU] | INTRAVENOUS | Status: DC
  Filled 2017-06-13: qty 50

## 2017-06-13 MED ORDER — ACETAMINOPHEN 325 MG PO TABS
650.0000 mg | ORAL_TABLET | ORAL | Status: DC | PRN
Start: 2017-06-13 — End: 2017-06-15

## 2017-06-13 NOTE — Anesthesia Procedure Notes (Signed)
Epidural Patient location during procedure: OB Start time: 06/13/2017 2:43 PM  Staffing Anesthesiologist: Leonides GrillsEllender, Ryan P, MD Performed: anesthesiologist   Preanesthetic Checklist Completed: patient identified, site marked, pre-op evaluation, timeout performed, IV checked, risks and benefits discussed and monitors and equipment checked  Epidural Patient position: sitting Prep: DuraPrep Patient monitoring: heart rate, cardiac monitor, continuous pulse ox and blood pressure Approach: midline Location: L4-L5 Injection technique: LOR air  Needle:  Needle type: Tuohy  Needle gauge: 17 G Needle length: 9 cm Needle insertion depth: 7 cm Catheter type: closed end flexible Catheter size: 19 Gauge Catheter at skin depth: 12 cm Test dose: negative and Other  Assessment Events: blood not aspirated, injection not painful, no injection resistance and negative IV test  Additional Notes Informed consent obtained prior to proceeding including risk of failure, 1% risk of PDPH, risk of minor discomfort and bruising. Discussed alternatives to epidural analgesia and patient desires to proceed.  Timeout performed pre-procedure verifying patient name, procedure, and platelet count.  Patient tolerated procedure well. Reason for block:procedure for pain

## 2017-06-13 NOTE — Progress Notes (Signed)
Audrey Butler is a 21 y.o. G1P0 at 2572w0d by ultrasound admitted for induction of labor due to Hypertension.  Subjective:   Objective: BP (!) 156/92   Pulse (!) 101   Temp 98.6 F (37 C) (Oral)   Resp 17   Ht 5\' 11"  (1.803 m)   Wt 252 lb (114.3 kg)   LMP 11/08/2016   SpO2 100%   BMI 35.15 kg/m  No intake/output data recorded. No intake/output data recorded.  FHT:  FHR: 140's bpm, variability: moderate,  accelerations:  Present,  decelerations:  Absent UC:   regular, every 2 minutes SVE:   Dilation: 7 Effacement (%): 90 Station: -2 Exam by:: Hart RochesterLawson, CNM  Labs: Lab Results  Component Value Date   WBC 7.2 06/13/2017   HGB 10.8 (L) 06/13/2017   HCT 33.5 (L) 06/13/2017   MCV 81.7 06/13/2017   PLT 213 06/13/2017    Assessment / Plan: Induction of labor due to gestational hypertension,  progressing well on pitocin  Labor: Progressing normally Preeclampsia:  no signs or symptoms of toxicity Fetal Wellbeing:  Category I Pain Control:  Labor support without medications I/D:  n/a Anticipated MOD:  NSVD  Wyvonnia DuskyMarie Lawson 06/13/2017, 12:41 PM

## 2017-06-13 NOTE — Anesthesia Preprocedure Evaluation (Signed)
Anesthesia Evaluation  Patient identified by MRN, date of birth, ID band Patient awake    Reviewed: Allergy & Precautions, H&P , NPO status , Patient's Chart, lab work & pertinent test results  History of Anesthesia Complications Negative for: history of anesthetic complications  Airway Mallampati: II  TM Distance: >3 FB Neck ROM: full    Dental no notable dental hx. (+) Teeth Intact   Pulmonary neg pulmonary ROS,    Pulmonary exam normal breath sounds clear to auscultation       Cardiovascular negative cardio ROS Normal cardiovascular exam Rhythm:regular Rate:Normal     Neuro/Psych negative neurological ROS  negative psych ROS   GI/Hepatic negative GI ROS, Neg liver ROS,   Endo/Other  negative endocrine ROS  Renal/GU negative Renal ROS  negative genitourinary   Musculoskeletal   Abdominal (+) + obese,   Peds  Hematology  (+) anemia ,   Anesthesia Other Findings   Reproductive/Obstetrics (+) Pregnancy                             Anesthesia Physical Anesthesia Plan  ASA: II  Anesthesia Plan: Epidural   Post-op Pain Management:    Induction:   PONV Risk Score and Plan:   Airway Management Planned:   Additional Equipment:   Intra-op Plan:   Post-operative Plan:   Informed Consent: I have reviewed the patients History and Physical, chart, labs and discussed the procedure including the risks, benefits and alternatives for the proposed anesthesia with the patient or authorized representative who has indicated his/her understanding and acceptance.     Plan Discussed with:   Anesthesia Plan Comments:         Anesthesia Quick Evaluation  

## 2017-06-13 NOTE — H&P (Signed)
Audrey Butler is a 21 y.o. female G1 @ 37 wks  presenting for IOL for GHTN. GBS pos OB History    Gravida Para Term Preterm AB Living   1             SAB TAB Ectopic Multiple Live Births                 Past Medical History:  Diagnosis Date  . Medical history non-contributory    Past Surgical History:  Procedure Laterality Date  . NO PAST SURGERIES     Family History: family history is not on file. Social History:  reports that  has never smoked. she has never used smokeless tobacco. She reports that she does not drink alcohol or use drugs.     Maternal Diabetes: No Genetic Screening: Normal Maternal Ultrasounds/Referrals: Normal Fetal Ultrasounds or other Referrals:  None Maternal Substance Abuse:  No Significant Maternal Medications:  None Significant Maternal Lab Results:  None Other Comments:  None  Review of Systems  Constitutional: Negative.   HENT: Negative.   Eyes: Negative.   Respiratory: Negative.   Cardiovascular: Negative.   Gastrointestinal: Negative.   Genitourinary: Negative.   Musculoskeletal: Negative.   Skin: Negative.   Neurological: Negative.   Endo/Heme/Allergies: Negative.   Psychiatric/Behavioral: Negative.    Maternal Medical History:  Reason for admission: IOL for hypertension  Contractions: none  Fetal activity: Perceived fetal activity is normal.   Last perceived fetal movement was within the past hour.    Prenatal complications: PIH.   Prenatal Complications - Diabetes: none.    Dilation: 7 Effacement (%): 90 Station: -2 Exam by:: Hart RochesterLawson, CNM Blood pressure 137/85, pulse 91, temperature 98.1 F (36.7 C), temperature source Oral, resp. rate 18, height 5\' 11"  (1.803 m), weight 252 lb (114.3 kg), last menstrual period 11/08/2016, SpO2 100 %. Maternal Exam:  Uterine Assessment: Contraction frequency is irregular.  Mild occasional contraction  Abdomen: Patient reports no abdominal tenderness. Fetal presentation:  vertex  Introitus: Normal vulva. Normal vagina.  Amniotic fluid character: not assessed.  Pelvis: adequate for delivery.   Cervix: Cervix evaluated by digital exam.     Fetal Exam Fetal Monitor Review: Mode: ultrasound.   Baseline rate: 140.  Variability: moderate (6-25 bpm).   Pattern: accelerations present.    Fetal State Assessment: Category I - tracings are normal.     Physical Exam  Constitutional: She is oriented to person, place, and time. She appears well-developed and well-nourished.  HENT:  Head: Normocephalic.  Eyes: Pupils are equal, round, and reactive to light.  Neck: Normal range of motion.  Cardiovascular: Normal rate, regular rhythm, normal heart sounds and intact distal pulses.  Respiratory: Effort normal and breath sounds normal.  GI: Soft. Bowel sounds are normal.  Genitourinary: Vagina normal and uterus normal.  Musculoskeletal: Normal range of motion.  Neurological: She is alert and oriented to person, place, and time. She has normal reflexes.  Skin: Skin is warm and dry.  Psychiatric: She has a normal mood and affect. Her behavior is normal. Judgment and thought content normal.    Prenatal labs: ABO, Rh: O/Positive/-- (08/27 1234) Antibody: Negative (08/27 1234) Rubella: 12.30 (08/27 1234) RPR: Non Reactive (10/22 1016)  HBsAg: Negative (08/27 1234)  HIV: Non Reactive (10/22 1016)  GBS: Positive (12/17 1055)   Assessment/Plan: IOL for hypertension @ 37 wks SVE 7/80/-1 Stable maternalfetal unit GBS pos admit Wyvonnia DuskyMarie Lawson 06/13/2017, 10:19 AM

## 2017-06-13 NOTE — Anesthesia Pain Management Evaluation Note (Signed)
  CRNA Pain Management Visit Note  Patient: Audrey Butler, 21 y.o., female  "Hello [facility] am [facility]. We have an anesthesia team available at all times to provide care throughout the hospital, including epidural management and anesthesia for C-section. I don't know your plan for the delivery whether it a natural birth, water birth, IV sedation, nitrous supplementation, doula or epidural, but we want to meet your pain goals."   1.Was your pain managed to your expectations on prior hospitalizations?   No prior hospitalizations  2.What is your expectation for pain management during this hospitalization?     Epidural  3.How can we help you reach that goal? Planning epidural  Record the patient's initial score and the patient's pain goal.   Pain: 6  Pain Goal: 8 [facility] wants you to be able to say your pain was always managed very well.  Rica RecordsICKELTON,Ritchard Paragas 06/13/2017

## 2017-06-14 ENCOUNTER — Other Ambulatory Visit: Payer: Self-pay

## 2017-06-14 MED ORDER — IBUPROFEN 600 MG PO TABS: 600.0000 mg | ORAL_TABLET | Freq: Four times a day (QID) | ORAL | 0 refills | Status: DC

## 2017-06-14 MED ORDER — AMLODIPINE BESYLATE 5 MG PO TABS
5.0000 mg | ORAL_TABLET | Freq: Every day | ORAL | Status: DC
  Administered 2017-06-14 – 2017-06-15 (×2): 5 mg via ORAL
  Filled 2017-06-14 (×3): qty 1

## 2017-06-14 MED ORDER — AMLODIPINE BESYLATE 5 MG PO TABS: 5.0000 mg | ORAL_TABLET | Freq: Every day | ORAL | 2 refills | Status: DC

## 2017-06-14 NOTE — Discharge Summary (Signed)
OB Discharge Summary     Patient Name: Audrey Butler DOB: 09/17/1995 MRN: [id number] Date of admission: 06/13/2017  Delivering MD: Willodean RosenthalHARRAWAY-SMITH, CAROLYN )  Date of discharge: 06/15/2017    Admitting diagnosis: gHTN Intrauterine pregnancy: 1107w0d    Secondary diagnosis:  Active Problems:   Patient Active Problem List   Diagnosis Date Noted  . Encounter for vaginal delivery 06/13/2017  . Gestational hypertension 06/11/2017  . Positive GBS test 06/03/2017  . Supervision of normal first pregnancy, antepartum 02/08/2017    Additional problems: none     Discharge diagnosis: Term Pregnancy Delivered                                                                                                Post partum procedures:none  Complications: None  Hospital course:  Induction of Labor With Vaginal Delivery   21 y.o. yo G1P1001 at 52107w0d was admitted to the hospital 06/13/2017 for induction of labor.  Indication for induction: Gestational hypertension.  Patient had an uncomplicated labor course as follows: Membrane Rupture Time/Date: 1:16 PM ,06/13/2017   Intrapartum Procedures: Episiotomy: None [1]                                         Lacerations:  None [1];1st degree [2]  Patient had delivery of a Viable infant.  Information for the patient's newborn:  Kellie ShropshireRoberson, Boy Aylssa [[id number]][[id number]]  Delivery Method: Vaginal, Spontaneous(Filed from Delivery Summary)   06/13/2017  Details of delivery can be found in separate delivery note.  Patient had elevated blood pressure during postpartum period. Started patient on amlodipine 5 mg daily and will prescribe at discharge. Patient is discharged home 06/15/17.  Physical exam  Vitals:   06/14/17 1903 06/15/17 0500  BP: (!) 149/66 133/83  Pulse: 76 95  Resp: 17 18  Temp: 98.3 F (36.8 C) 97.6 F (36.4 C)  SpO2:      General: alert, cooperative and no distress Lochia: appropriate Uterine Fundus: firm Incision: N/A DVT Evaluation:  No evidence of DVT seen on physical exam.  Labs: No results found for this or any previous visit (from the past 24 hour(s)).   Discharge instruction: per After Visit Summary and "Baby and Me Booklet".  After visit meds:  No Known Allergies  Allergies as of 06/15/2017   No Known Allergies     Medication List    TAKE these medications   amLODipine 5 MG tablet Commonly known as:  NORVASC Take 1 tablet (5 mg total) by mouth daily.   CITRANATAL BLOOM 90-1 MG Tabs Take 1 tablet by mouth daily.   ibuprofen 600 MG tablet Commonly known as:  ADVIL,MOTRIN Take 1 tablet (600 mg total) by mouth every 6 (six) hours.        Diet: routine diet  Activity: Advance as tolerated. Pelvic rest for 6 weeks.   Outpatient follow up:1 week Future Appointments:  Future Appointments  Date Time Provider Department Center  07/12/2017  1:30 PM Brock BadHarper, Charles A, MD [facility] None  Follow up Appt: No Follow-up on file.     Postpartum contraception: nexplanon  Newborn Data: APGAR (1 MIN): 9   APGAR (5 MINS): 9      Baby Feeding: bottle Disposition:home with mother  Rolm Bookbindermber Tanikka Bresnan, DO  06/15/2017

## 2017-06-14 NOTE — Lactation Note (Signed)
This note was copied from a baby's chart. Lactation Consultation Note  Patient Name: Audrey Butler ZOXWR'UToday's Date: 06/14/2017 Reason for consult: Follow-up assessment  8429 hours old female, 3737 weeker who was supplemented with formula due to a blood sugar of 45 mg/dl. Set mom with a DEBP, mom was able to get a few drops of colostrum. Taught hand expression and breast massage, mom complained that nipples were sensitive but no cracks, positional stripes or blister were noted, breast are intact.  Baby latched at the breast with very little difficulty and was able to sustain latch. Still BF when exiting room. Mom got reassurance about her milk supply and explained that her milk will eventually come in and that baby was going to get a new blood sugar test at 10 pm to monitor him closely. Advised mom to feed at least 8 times in 24 hours and to pump after feedings.  LATCH Score Latch: Grasps breast easily, tongue down, lips flanged, rhythmical sucking.  Audible Swallowing: A few with stimulation  Type of Nipple: Everted at rest and after stimulation  Comfort (Breast/Nipple): Soft / non-tender  Hold (Positioning): Assistance needed to correctly position infant at breast and maintain latch.  LATCH Score: 8  Interventions Interventions: Breast feeding basics reviewed;Assisted with latch;Skin to skin;Breast massage;Breast compression;Support pillows;Position options;Expressed milk;DEBP  Lactation Tools Discussed/Used Pump Review: Setup, frequency, and cleaning Initiated by:: MPeck, MS, IBCLC Date initiated:: 06/14/17   Consult Status Consult Status: Follow-up Date: 06/15/17 Follow-up type: In-patient    Dorris Pierre Venetia ConstableS Tanishia Lemaster 06/14/2017, 10:14 PM

## 2017-06-14 NOTE — Lactation Note (Signed)
This note was copied from a baby's chart. Lactation Consultation Note  Patient Name: Audrey Butler ZOXWR'UToday's Date: 06/14/2017 Reason for consult: Initial assessment;Primapara;Early term 37-38.6wks Breastfeeding consultation services and support information given and reviewed.  This is mom's first baby and newborn is 7617 hours old.  Mom reports baby is latching easily.  Observed a latch and baby did latch well.  Instructed to hold baby closer during feeding and massage breasts off and on. Baby is sleepy and needing stimulation to feed.  Recommended skin to skin for feedings.  Encouraged to call with concerns/assist prn.  Maternal Data Has patient been taught Hand Expression?: Yes Does the patient have breastfeeding experience prior to this delivery?: No  Feeding Feeding Type: Breast Fed  LATCH Score Latch: Grasps breast easily, tongue down, lips flanged, rhythmical sucking.  Audible Swallowing: A few with stimulation  Type of Nipple: Everted at rest and after stimulation  Comfort (Breast/Nipple): Soft / non-tender  Hold (Positioning): Assistance needed to correctly position infant at breast and maintain latch.  LATCH Score: 8  Interventions Interventions: Breast feeding basics reviewed  Lactation Tools Discussed/Used     Consult Status Consult Status: Follow-up Date: 06/15/17 Follow-up type: In-patient    Huston FoleyMOULDEN, Cohan Stipes S 06/14/2017, 10:25 AM

## 2017-06-14 NOTE — Anesthesia Postprocedure Evaluation (Signed)
Anesthesia Post Note  Patient: Audrey Butler  Procedure(s) Performed: AN AD HOC LABOR EPIDURAL     Patient location during evaluation: Mother Baby Anesthesia Type: Epidural Level of consciousness: awake and alert and oriented Pain management: satisfactory to patient Vital Signs Assessment: post-procedure vital signs reviewed and stable Respiratory status: spontaneous breathing and nonlabored ventilation Cardiovascular status: stable Postop Assessment: no headache, no backache, no signs of nausea or vomiting, adequate PO intake and patient able to bend at knees (patient up walking) Anesthetic complications: no    Last Vitals:  Vitals:   06/14/17 0026 06/14/17 0517  BP: 135/71 124/64  Pulse: 89 76  Resp: 18 16  Temp: 37 C 36.7 C  SpO2:  100%    Last Pain:  Vitals:   06/14/17 0738  TempSrc:   PainSc: 0-No pain   Pain Goal:                 Madison HickmanGREGORY,Takahiro Godinho

## 2017-06-14 NOTE — Progress Notes (Signed)
Post Partum Day 1 Subjective: no complaints, up ad lib, voiding and tolerating PO  Objective: Blood pressure 124/64, pulse 76, temperature 98.1 F (36.7 C), temperature source Oral, resp. rate 16, height 5\' 11"  (1.803 m), weight 240 lb (108.9 kg), last menstrual period 11/08/2016, SpO2 100 %, unknown if currently breastfeeding.  Physical Exam:  General: alert and no distress Lochia: appropriate Uterine Fundus: firm DVT Evaluation: No evidence of DVT seen on physical exam.  Recent Labs    06/13/17 0810  HGB 10.8*  HCT 33.5*    Assessment/Plan: Plan for discharge tomorrow, Breastfeeding and Contraception wants info on contraception   LOS: 1 day   Audrey RosenthalCarolyn Butler 06/14/2017, 8:09 AM

## 2017-06-14 NOTE — Discharge Instructions (Signed)

## 2017-06-15 NOTE — Lactation Note (Signed)
This note was copied from a baby's chart. Lactation Consultation Note  Patient Name: Audrey Butler [id number] Date: 06/15/2017 Reason for consult: Follow-up assessment Baby at 39 hr of life and dyad set for d/c today. Mom is "aggervated with bf". She thinks that she might stop latching baby when she gets home. She is leaning toward pumping to feed and formula. She does not have a DEBP but plans to contact [facility] tomorrow. Reviewed the pump kit she has in the room. Discussed baby behavior, feeding frequency, pumping, supplementing guidelines, baby belly size, voids, wt loss, breast changes, and nipple care. Mom is aware of lactation services and support group. She will call as needed.    Maternal Data    Feeding Feeding Type: Formula Length of feed: 23 min  LATCH Score Latch: Grasps breast easily, tongue down, lips flanged, rhythmical sucking.  Audible Swallowing: A few with stimulation  Type of Nipple: Everted at rest and after stimulation  Comfort (Breast/Nipple): Soft / non-tender  Hold (Positioning): No assistance needed to correctly position infant at breast.  LATCH Score: 9  Interventions Interventions: Breast feeding basics reviewed;Hand express;DEBP;Hand pump  Lactation Tools Discussed/Used     Consult Status Consult Status: Complete Follow-up type: Call as needed    Denzil Hughes 06/15/2017, 8:15 AM

## 2017-06-17 ENCOUNTER — Encounter: Payer: Medicaid Other | Admitting: Nurse Practitioner

## 2017-06-21 ENCOUNTER — Other Ambulatory Visit: Payer: Self-pay | Admitting: Certified Nurse Midwife

## 2017-07-12 ENCOUNTER — Encounter: Payer: Self-pay | Admitting: Obstetrics

## 2017-07-12 ENCOUNTER — Ambulatory Visit (INDEPENDENT_AMBULATORY_CARE_PROVIDER_SITE_OTHER): Payer: Medicaid Other | Admitting: Obstetrics

## 2017-07-12 DIAGNOSIS — Z1389 Encounter for screening for other disorder: Secondary | ICD-10-CM

## 2017-07-12 DIAGNOSIS — Z3009 Encounter for other general counseling and advice on contraception: Secondary | ICD-10-CM

## 2017-07-12 NOTE — Progress Notes (Signed)
Post Partum Exam  Audrey Butler is a 22 y.o. 701P1001 female who presents for a postpartum visit. She is 4 weeks postpartum following a spontaneous vaginal delivery. I have fully reviewed the prenatal and intrapartum course. The delivery was at 37 gestational weeks.  Anesthesia: epidural. Postpartum course has been unremarkable. Baby's course has been unremarkable. Baby is feeding by bottle - Octavia HeirGerber Soothe. Bleeding no bleeding. Bowel function is Blood in stool/ constipation . Bladder function is normal. Patient is not sexually active. Contraception method is none. Postpartum depression screening:neg  The following portions of the patient's history were reviewed and updated as appropriate: allergies, current medications, past family history, past medical history, past social history, past surgical history and problem list. Last pap smear done n/a and was n/a  Review of Systems A comprehensive review of systems was negative.    Objective:  Last menstrual period 11/08/2016, unknown if currently breastfeeding.  General:  alert and no distress   Breasts:  inspection negative, no nipple discharge or bleeding, no masses or nodularity palpable  Lungs: clear to auscultation bilaterally  Heart:  regular rate and rhythm, S1, S2 normal, no murmur, click, rub or gallop  Abdomen: soft, non-tender; bowel sounds normal; no masses,  no organomegaly   Vulva:  not evaluated  Vagina: not evaluated  Cervix:  not evaluated  Corpus: not examined  Adnexa:  not evaluated  Rectal Exam: Not performed.        Assessment:   1. Postpartum care following vaginal delivery - doing well  2. Encounter for other general counseling and advice on contraception - wants Nexplanon   Plan:   1. Contraception: Nexplanon 2. Nexplanon Rx 3. Follow up in: 2 weeks for Nexplanon Insertion  Brock BadHARLES A. HARPER MD

## 2017-07-26 ENCOUNTER — Encounter: Payer: Self-pay | Admitting: Obstetrics

## 2017-07-26 ENCOUNTER — Encounter: Payer: Self-pay | Admitting: *Deleted

## 2017-07-26 ENCOUNTER — Ambulatory Visit (INDEPENDENT_AMBULATORY_CARE_PROVIDER_SITE_OTHER): Payer: Medicaid Other | Admitting: Obstetrics

## 2017-07-26 VITALS — BP 145/95 | HR 80 | Temp 98.3°F | Resp 16 | Wt 231.4 lb

## 2017-07-26 DIAGNOSIS — Z3202 Encounter for pregnancy test, result negative: Secondary | ICD-10-CM

## 2017-07-26 DIAGNOSIS — Z3049 Encounter for surveillance of other contraceptives: Secondary | ICD-10-CM

## 2017-07-26 DIAGNOSIS — Z30017 Encounter for initial prescription of implantable subdermal contraceptive: Secondary | ICD-10-CM

## 2017-07-26 LAB — POCT URINE PREGNANCY: Preg Test, Ur: NEGATIVE

## 2017-07-26 MED ORDER — ETONOGESTREL 68 MG ~~LOC~~ IMPL
68.0000 mg | DRUG_IMPLANT | Freq: Once | SUBCUTANEOUS | Status: AC
  Administered 2017-07-26: 68 mg via SUBCUTANEOUS

## 2017-07-26 NOTE — Addendum Note (Signed)
Addended by: Areta HaberMOREHEAD, Berthe Oley B on: 07/26/2017 05:08 PM   Modules accepted: Orders

## 2017-07-26 NOTE — Progress Notes (Signed)
Nexplanon Procedure Note   PRE-OP DIAGNOSIS: desired long-term, reversible contraception ( LARC ) POST-OP DIAGNOSIS: Same  PROCEDURE: Nexplanon  placement Performing Provider: Brock BadHARLES A. HARPER MD  Patient education prior to procedure, explained risk, benefits of Nexplanon, reviewed alternative options. Patient reported understanding. Gave consent to continue with procedure.   PROCEDURE:  Pregnancy Text :  Negative Site (check):      left arm         Sterile Preparation:   Betadinex3 Lot # [id number] Expiration Date  06 / 2021  Insertion site was selected 8 - 10 cm from medial epicondyle and marked along with guiding site using sterile marker. Procedure area was prepped and draped in a sterile fashion. 1% Lidocaine 1.5 ml given prior to procedure. Nexplanon  was inserted subcutaneously.Needle was removed from the insertion site. Nexplanon capsule was palpated by provider and patient to assure satisfactory placement. Dressing applied.  Followup: The patient tolerated the procedure well without complications.  Standard post-procedure care is explained and return precautions are given.  Brock BadHARLES A. HARPER MD

## 2017-08-16 ENCOUNTER — Ambulatory Visit: Payer: Medicaid Other | Admitting: Obstetrics

## 2017-08-17 ENCOUNTER — Ambulatory Visit (INDEPENDENT_AMBULATORY_CARE_PROVIDER_SITE_OTHER): Payer: Medicaid Other | Admitting: Certified Nurse Midwife

## 2017-08-17 ENCOUNTER — Encounter: Payer: Self-pay | Admitting: Certified Nurse Midwife

## 2017-08-17 ENCOUNTER — Ambulatory Visit: Payer: Medicaid Other | Admitting: Obstetrics

## 2017-08-17 VITALS — BP 150/92 | HR 94 | Wt 232.0 lb

## 2017-08-17 DIAGNOSIS — Z309 Encounter for contraceptive management, unspecified: Secondary | ICD-10-CM

## 2017-08-17 DIAGNOSIS — I10 Essential (primary) hypertension: Secondary | ICD-10-CM

## 2017-08-17 NOTE — Progress Notes (Signed)
Patient ID: Audrey Butler, female   DOB: Jan 04, 1996, 22 y.o.   MRN: [id number]  Chief Complaint  Patient presents with  . Follow-up    on Nexplanon inserted on 07/26/17    HPI Audrey Butler is a 22 y.o. female.  F/U Nexplanon, states cramping occasionally and has not tried anything for it.  Denies any periods since insertion.  Discussed normal side effects of birth control.   Infant is bottle feeding.  Discussed blood pressure management.    HPI  Past Medical History:  Diagnosis Date  . Medical history non-contributory     Past Surgical History:  Procedure Laterality Date  . NO PAST SURGERIES      History reviewed. No pertinent family history.  Social History Social History   Tobacco Use  . Smoking status: Never Smoker  . Smokeless tobacco: Never Used  Substance Use Topics  . Alcohol use: No  . Drug use: No    No Known Allergies  Current Outpatient Medications  Medication Sig Dispense Refill  . amLODipine (NORVASC) 5 MG tablet Take 1 tablet (5 mg total) by mouth daily. 30 tablet 2  . ibuprofen (ADVIL,MOTRIN) 600 MG tablet Take 1 tablet (600 mg total) by mouth every 6 (six) hours. (Patient not taking: Reported on 08/17/2017) 30 tablet 0  . Prenatal-DSS-FeCb-FeGl-FA (CITRANATAL BLOOM) 90-1 MG TABS Take 1 tablet by mouth daily. (Patient not taking: Reported on 08/17/2017) 30 tablet 12   No current facility-administered medications for this visit.     Review of Systems Review of Systems Constitutional: negative for fatigue and weight loss Respiratory: negative for cough and wheezing Cardiovascular: negative for chest pain, fatigue and palpitations Gastrointestinal: negative for abdominal pain and change in bowel habits Genitourinary:negative Integument/breast: negative for nipple discharge Musculoskeletal:negative for myalgias Neurological: negative for gait problems and tremors Behavioral/Psych: negative for abusive relationship, depression Endocrine: negative  for temperature intolerance      Blood pressure (!) 150/92, pulse 94, weight 232 lb (105.2 kg), last menstrual period 07/17/2017, not currently breastfeeding.  Physical Exam Physical Exam General:   alert  PE: deferred  100% of 15 min visit spent on counseling and coordination of care.   Data Reviewed Previous medical history, meds  Assessment     Chronic Hypertension Nexplanon contraception status    Plan   Dash diet and exercise.   Orders Placed This Encounter  Procedures  . Ambulatory referral to Internal Medicine    Referral Priority:   Routine    Referral Type:   Consultation    Referral Reason:   Specialty Services Required    Requested Specialty:   Internal Medicine    Number of Visits Requested:   1     Follow up as needed.

## 2017-08-17 NOTE — Progress Notes (Signed)
RGYN pt here to F/U on nexplanon.  BP elevated. Pt stats she did not take BP meds today.

## 2017-12-02 ENCOUNTER — Encounter (HOSPITAL_COMMUNITY): Payer: Self-pay | Admitting: Emergency Medicine

## 2017-12-02 ENCOUNTER — Emergency Department (HOSPITAL_COMMUNITY)
Admission: EM | Admit: 2017-12-02 | Discharge: 2017-12-02 | Disposition: A | Discharge status: Home / Self Care | Payer: Medicaid Other | Attending: Emergency Medicine | Admitting: Emergency Medicine

## 2017-12-02 DIAGNOSIS — Z79899 Other long term (current) drug therapy: Secondary | ICD-10-CM | POA: Diagnosis not present

## 2017-12-02 DIAGNOSIS — L600 Ingrowing nail: Secondary | ICD-10-CM | POA: Diagnosis not present

## 2017-12-02 MED ORDER — HYDROCODONE-ACETAMINOPHEN 5-325 MG PO TABS: ORAL_TABLET | ORAL | 0 refills | Status: DC

## 2017-12-02 MED ORDER — IBUPROFEN 600 MG PO TABS: 600.0000 mg | ORAL_TABLET | Freq: Four times a day (QID) | ORAL | 0 refills | Status: DC | PRN

## 2017-12-02 MED ORDER — LIDOCAINE HCL (PF) 1 % IJ SOLN
10.0000 mL | Freq: Once | INTRAMUSCULAR | Status: AC
Start: 2017-12-02 — End: 2017-12-02
  Administered 2017-12-02: 10 mL
  Filled 2017-12-02: qty 30

## 2017-12-02 NOTE — ED Triage Notes (Signed)
Patient here from home with complaints of right toe pain. Hx of ingrown toe pain.

## 2017-12-02 NOTE — Discharge Instructions (Signed)
Please soak your foot in warm Epson salt water daily for about 10 to 15 minutes until the wound heals.  Please apply a clean dressing daily.  Please use clean white socks daily.  Use the postoperative shoe until you can safely use regular shoes.  Use ibuprofen every 6 hours as needed for mild pain, use Norco for more severe pain. This medication may cause drowsiness. Please do not drink, drive, or participate in activity that requires concentration while taking this medication.  Please do not take these medications while breast-feeding.

## 2017-12-02 NOTE — ED Notes (Signed)
Discharge instructions reviewed with patient. Patient verbalizes understanding. VSS.   

## 2017-12-02 NOTE — ED Provider Notes (Signed)
[facility]-EMERGENCY DEPT Provider Note   CSN: [id number] Arrival date & time: 12/02/17  1535     History   Chief Complaint Chief Complaint  Patient presents with  . Toe Pain    HPI Audrey Butler is a 22 y.o. female.  The history is provided by the patient.  Toe Pain  This is a new problem. The current episode started more than 1 week ago. The problem occurs constantly. The problem has been gradually worsening. Pertinent negatives include no chest pain, no abdominal pain and no shortness of breath. The symptoms are aggravated by walking. Nothing relieves the symptoms. Treatments tried: warm soaks.    Past Medical History:  Diagnosis Date  . Medical history non-contributory     There are no active problems to display for this patient.   Past Surgical History:  Procedure Laterality Date  . NO PAST SURGERIES       OB History    Gravida  1   Para  1   Term  1   Preterm      AB      Living  1     SAB      TAB      Ectopic      Multiple  0   Live Births  1            Home Medications    Prior to Admission medications   Medication Sig Start Date End Date Taking? Authorizing Provider  amLODipine (NORVASC) 5 MG tablet Take 1 tablet (5 mg total) by mouth daily. 06/14/17   Moss, Amber, DO  ibuprofen (ADVIL,MOTRIN) 600 MG tablet Take 1 tablet (600 mg total) by mouth every 6 (six) hours. Patient not taking: Reported on 08/17/2017 06/15/17   Rachelle HoraMoss, [facility] Hospit[facility]mber, DO  Prenatal-DSS-FeCb-FeGl-FA (CITRANATAL BLOOM) 90-1 MG TABS Take 1 tablet by mouth daily. Patient not taking: Reported on 08/17/2017 04/08/17   Roe Coombsenney, Rachelle A, CNM    Family History No family history on file.  Social History Social History   Tobacco Use  . Smoking status: Never Smoker  . Smokeless tobacco: Never Used  Substance Use Topics  . Alcohol use: No  . Drug use: No     Allergies   Patient has no known allergies.   Review of Systems Review of Systems    Constitutional: Negative for activity change.       All ROS Neg except as noted in HPI  HENT: Negative for nosebleeds.   Eyes: Negative for photophobia and discharge.  Respiratory: Negative for cough, shortness of breath and wheezing.   Cardiovascular: Negative for chest pain and palpitations.  Gastrointestinal: Negative for abdominal pain and blood in stool.  Genitourinary: Negative for dysuria, frequency and hematuria.  Musculoskeletal: Negative for arthralgias, back pain and neck pain.       Toe pain  Skin: Negative.   Neurological: Negative for dizziness, seizures and speech difficulty.  Psychiatric/Behavioral: Negative for confusion and hallucinations.     Physical Exam Updated Vital Signs BP (!) 138/91 (BP Location: Right Arm)   Pulse (!) 102   Temp 98 F (36.7 C) (Oral)   Resp 18   SpO2 100%   Physical Exam  Constitutional: She is oriented to person, place, and time. She appears well-developed and well-nourished.  Non-toxic appearance.  HENT:  Head: Normocephalic.  Right Ear: Tympanic membrane and external ear normal.  Left Ear: Tympanic membrane and external ear normal.  Eyes: Pupils are equal, round, and reactive to  light. EOM and lids are normal.  Neck: Normal range of motion. Neck supple. Carotid bruit is not present.  Cardiovascular: Normal rate, regular rhythm, normal heart sounds, intact distal pulses and normal pulses.  Pulmonary/Chest: Breath sounds normal. No respiratory distress.  Abdominal: Soft. Bowel sounds are normal. There is no tenderness. There is no guarding.  Musculoskeletal: Normal range of motion.       Right foot: There is tenderness.  Right great toe has an ingrown nail.  Lymphadenopathy:       Head (right side): No submandibular adenopathy present.       Head (left side): No submandibular adenopathy present.    She has no cervical adenopathy.  Neurological: She is alert and oriented to person, place, and time. She has normal strength. No  cranial nerve deficit or sensory deficit.  Skin: Skin is warm and dry.  Psychiatric: She has a normal mood and affect. Her speech is normal.  Nursing note and vitals reviewed.    ED Treatments / Results  Labs (all labs ordered are listed, but only abnormal results are displayed) Labs Reviewed - No data to display  EKG None  Radiology No results found.  Procedures .Nail Removal Date/Time: 12/02/2017 6:58 PM Performed by: Ivery Quale, PA-C Authorized by: Ivery Quale, PA-C   Consent:    Consent obtained:  Verbal   Consent given by:  Patient   Risks discussed:  Bleeding, incomplete removal, infection, pain and permanent nail deformity Location:    Foot:  R big toe Pre-procedure details:    Skin preparation:  Betadine   Preparation: Patient was prepped and draped in the usual sterile fashion   Anesthesia (see MAR for exact dosages):    Anesthesia method:  Nerve block   Block location:  Right first toe   Block needle gauge:  25 G   Block anesthetic:  Lidocaine 1% w/o epi   Block technique:  Digital   Block injection procedure:  Anatomic landmarks identified, introduced needle, incremental injection and negative aspiration for blood   Block outcome:  Anesthesia achieved Nail Removal:    Nail removed:  Partial   Nail side:  Medial Ingrown nail:    Wedge excision of skin: yes   Post-procedure details:    Dressing:  4x4 sterile gauze and post-op shoe   Patient tolerance of procedure:  Tolerated well, no immediate complications   (including critical care time)  Medications Ordered in ED Medications - No data to display   Initial Impression / Assessment and Plan / ED Course  I have reviewed the triage vital signs and the nursing notes.  Pertinent labs & imaging results that were available during my care of the patient were reviewed by me and considered in my medical decision making (see chart for details).       Final Clinical Impressions(s) / ED  Diagnoses MDM  Vital signs within normal limits.  There is mild swelling, and signs of infection noted at the ingrown nail site.  No red streaks appreciated.  The toe is not hot.  A portion of the medial aspect of the nail of the great toe was removed, the infected area was irrigated repeatedly.  A bulky dressing and postoperative shoe was applied to the area.  Patient states that he feels much better after the procedure.  Patient advised to cleanse the area daily with soap and water and to change the dressing daily.  The patient is to use the postoperative shoe until she can safely apply  her regular shoes.  The patient will use ibuprofen every 6 hours for pain.  Patient will use Norco( #10 tabs) for more severe pain.  Patient will see the primary physician or return to the emergency department if any changes, problems, or concerns.   Final diagnoses:  Ingrown right big toenail    ED Discharge Orders        Ordered    ibuprofen (ADVIL,MOTRIN) 600 MG tablet  Every 6 hours PRN     12/02/17 1854    HYDROcodone-acetaminophen (NORCO/VICODIN) 5-325 MG tablet     12/02/17 1854       Ivery Quale, PA-C 12/03/17 1645    Mancel Bale, MD 12/06/17 1530

## 2018-03-21 IMAGING — US US MFM OB DETAIL+14 WK
1 series · 14 of 28 positions shown · non-contrast
Comparison: none

[Series 1: us mfm ob detail+14 wk · 102 acquisitions, 14 frames shown]
[im 4/102]
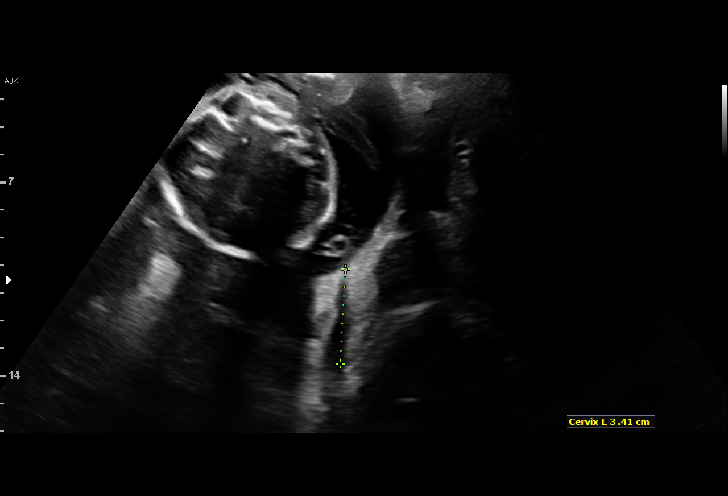
[im 12/102]
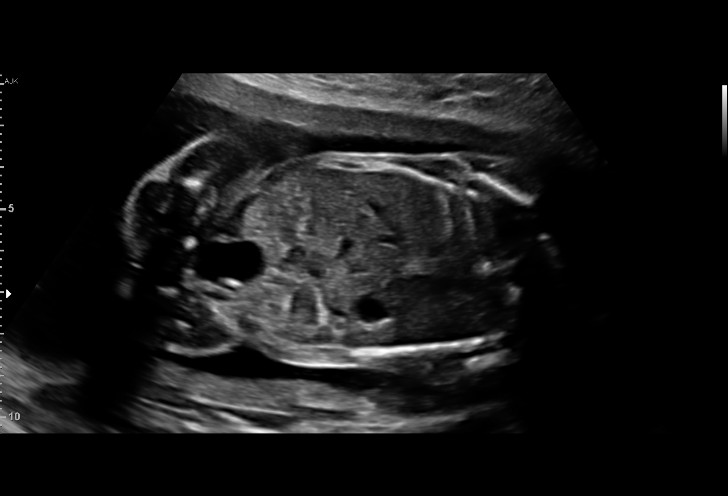
[im 19/102]
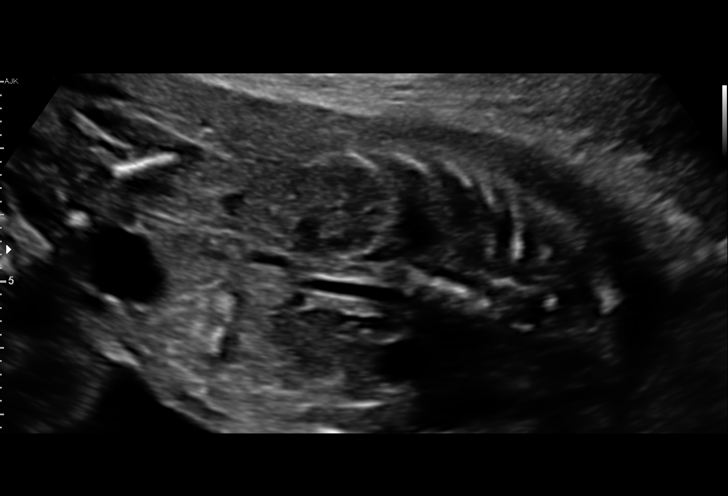
[im 27/102]
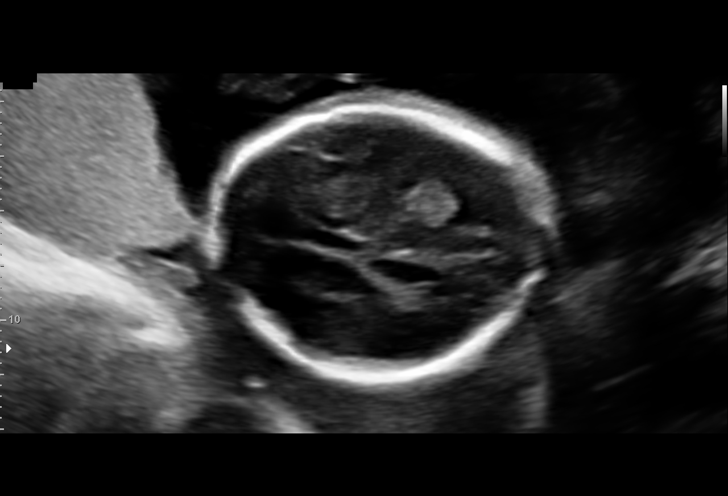
[im 34/102]
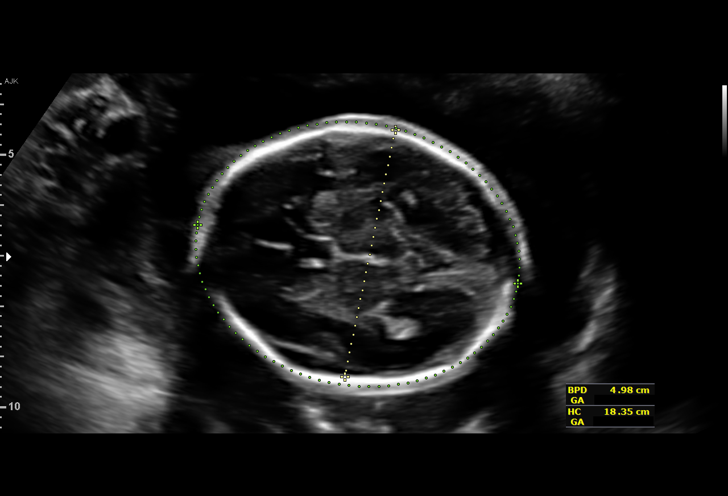
[im 42/102]
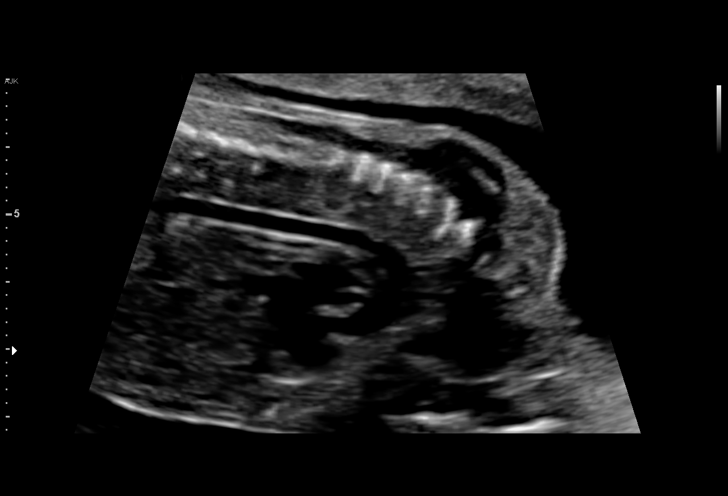
[im 49/102]
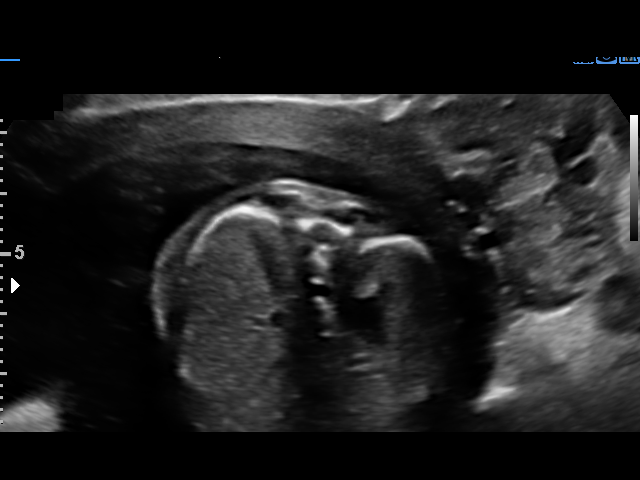
[im 57/102]
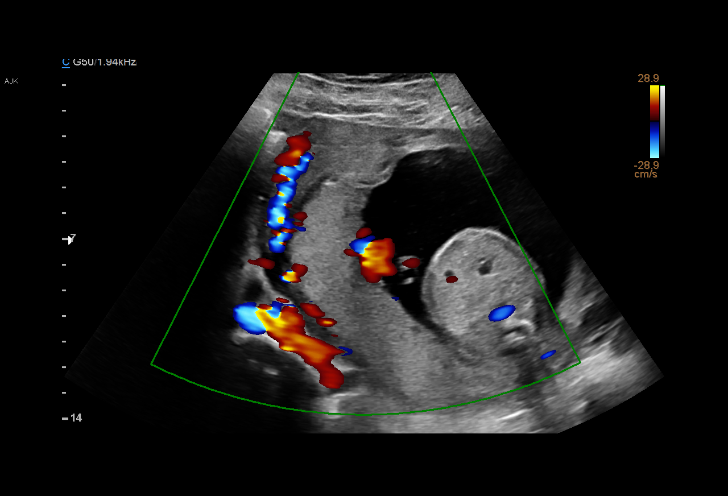
[im 64/102]
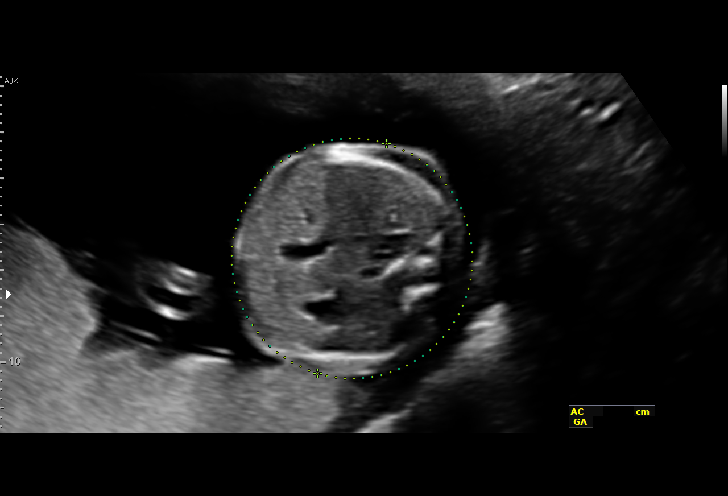
[im 72/102]
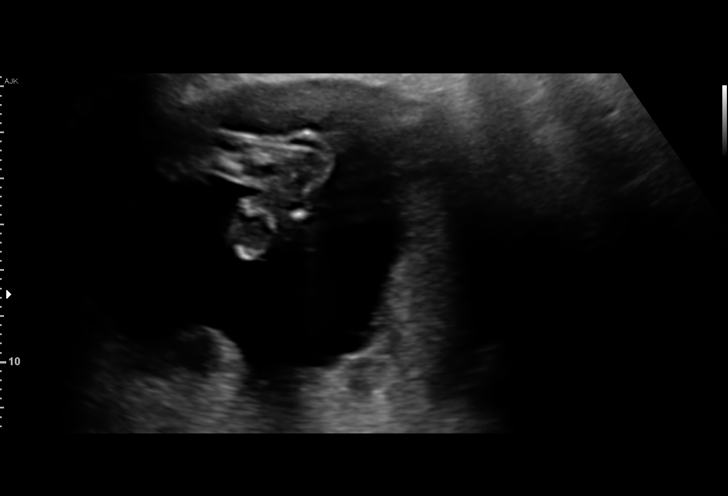
[im 79/102]
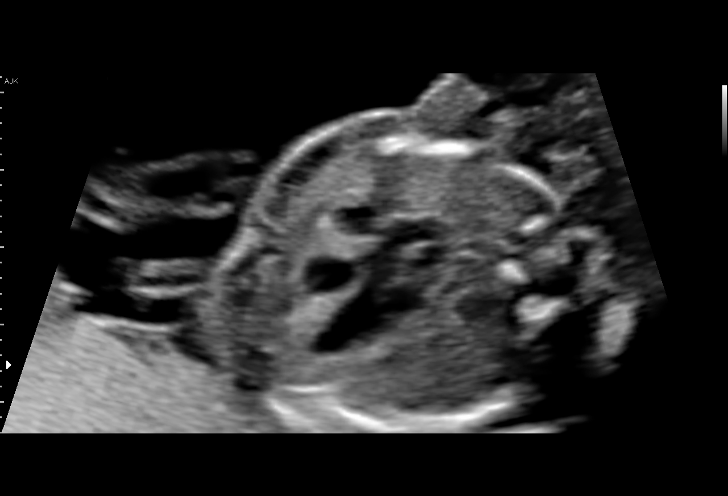
[im 87/102]
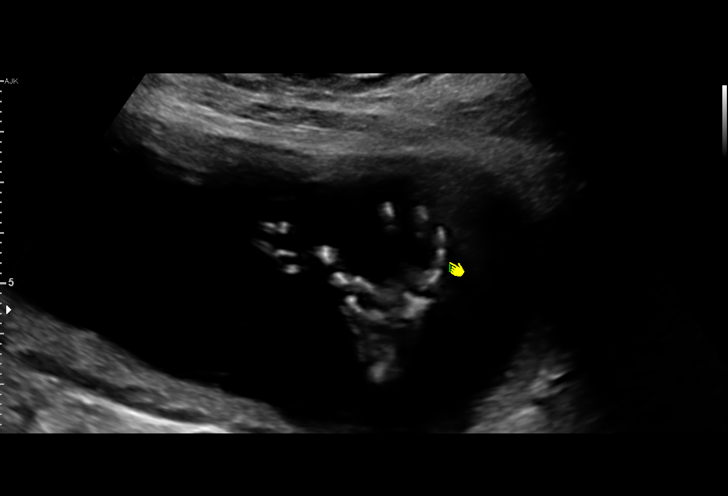
[im 94/102]
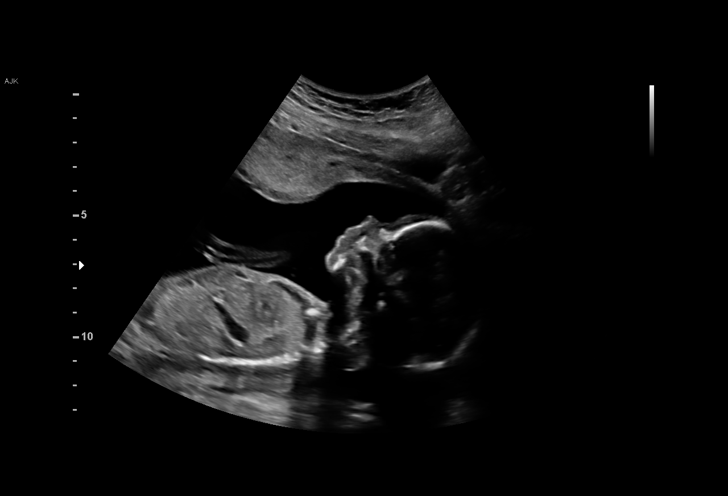
[im 102/102]
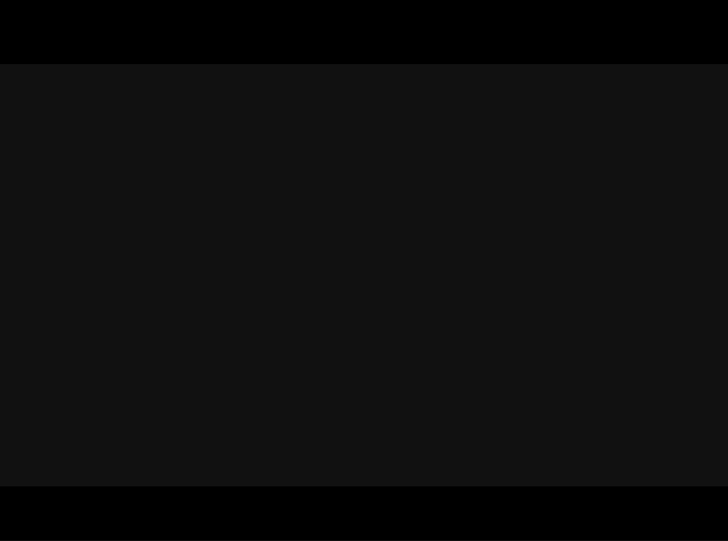

[14 of 28 positions shown; findings below may reference images not displayed]

1  NGUYET FACEY           [id number]      [id number]     [id number]
Indications

21 weeks gestation of pregnancy
Insufficient Prenatal Care
Obesity complicating pregnancy, second
trimester
Encounter for fetal anatomic survey
OB History

Blood Type:            Height:  5'11"  Weight (lb):  220      BMI:
Gravidity:    1
Fetal Evaluation

Num Of Fetuses:     1
Fetal Heart         140
Rate(bpm):
Cardiac Activity:   Observed
Presentation:       Variable
Placenta:           Fundal, above cervical os
P. Cord Insertion:  Visualized, central

Amniotic Fluid
AFI FV:      Subjectively within normal limits

Largest Pocket(cm)
5.36
Biometry

BPD:      49.9  mm     G. Age:  21w 0d         41  %    CI:         74.5   %   70 - 86
FL/HC:      18.5   %   15.9 -
HC:      183.5  mm     G. Age:  20w 5d         19  %    HC/AC:      1.11       1.06 -
AC:      165.9  mm     G. Age:  21w 4d         54  %    FL/BPD:     67.9   %
FL:       33.9  mm     G. Age:  20w 5d         21  %    FL/AC:      20.4   %   20 - 24

Est. FW:     402  gm    0 lb 14 oz      41  %
Gestational Age

LMP:           15w 2d       Date:   11/08/16                 EDD:   08/15/17
Clinical EDD:  21w 2d                                        EDD:   07/04/17
U/S Today:     21w 0d                                        EDD:   07/06/17
Best:          21w 2d    Det. By:   Clinical EDD             EDD:   07/04/17
Anatomy

Cranium:               Appears normal         Aortic Arch:            Appears normal
Cavum:                 Appears normal         Ductal Arch:            Appears normal
Ventricles:            Appears normal         Diaphragm:              Appears normal
Choroid Plexus:        Appears normal         Stomach:                Appears normal, left
sided
Cerebellum:            Not well visualized    Abdomen:                Appears normal
Posterior Fossa:       Not well visualized    Abdominal Wall:         Appears nml (cord
insert, abd wall)
Nuchal Fold:           Not applicable (>20    Cord Vessels:           Appears normal (3
wks GA)                                        vessel cord)
Face:                  Appears normal         Kidneys:                Appear normal
(orbits and profile)
Lips:                  Appears normal         Bladder:                Appears normal
Thoracic:              Appears normal         Spine:                  Appears normal
Heart:                 Appears normal         Upper Extremities:      Appears normal
(4CH, axis, and
situs)
RVOT:                  Appears normal         Lower Extremities:      Appears normal
LVOT:                  Appears normal

Other:  Fetus appears to be a male. Parents do not wish to know sex of fetus.
Heels and 5th digit visualized. Nasal bone visualized. Technically
difficult due to fetal position.
Cervix Uterus Adnexa

Cervix
Length:           3.41  cm.
Normal appearance by transabdominal scan.

Uterus
No abnormality visualized.

Left Ovary
Within normal limits.

Right Ovary
Within normal limits.

Adnexa:       No abnormality visualized.
Impression

Single living intrauterine pregnancy at 31w3d.
Fundal placenta without evidence of previa.
Appropriate fetal growth.
Normal amniotic fluid volume.
The posterior fossa is not well visualized due to fetal position.
The remainder of the fetal anatomic survey is complete.
Normal fetal anatomy.
No fetal anomalies or soft markers of aneuploidy seen.
Recommendations

Recommend follow-up ultrasound examination in 4-6 weeks
for completion of fetal anatomic survey.

## 2018-04-05 ENCOUNTER — Ambulatory Visit (INDEPENDENT_AMBULATORY_CARE_PROVIDER_SITE_OTHER): Payer: Medicaid Other | Admitting: Obstetrics

## 2018-04-05 ENCOUNTER — Encounter: Payer: Self-pay | Admitting: Obstetrics

## 2018-04-05 ENCOUNTER — Other Ambulatory Visit: Payer: Self-pay

## 2018-04-05 VITALS — BP 128/86 | HR 93 | Ht 71.0 in | Wt 242.0 lb

## 2018-04-05 DIAGNOSIS — Z01818 Encounter for other preprocedural examination: Secondary | ICD-10-CM

## 2018-04-05 DIAGNOSIS — Z3046 Encounter for surveillance of implantable subdermal contraceptive: Secondary | ICD-10-CM | POA: Diagnosis not present

## 2018-04-05 DIAGNOSIS — Z3009 Encounter for other general counseling and advice on contraception: Secondary | ICD-10-CM

## 2018-04-05 DIAGNOSIS — Z30011 Encounter for initial prescription of contraceptive pills: Secondary | ICD-10-CM

## 2018-04-05 MED ORDER — LEVONORGEST-ETH ESTRADIOL-IRON 0.1-20 MG-MCG(21) PO TABS: 1.0000 | ORAL_TABLET | Freq: Every day | ORAL | 11 refills | Status: DC

## 2018-04-05 NOTE — Progress Notes (Signed)
Presents for Nexplanon removal. Shheae had it for 9 months.  C/o migraine headaches 9/10 lasting 3 days at a time and cramps.

## 2018-04-05 NOTE — Progress Notes (Signed)
NEXPLANON REMOVAL NOTE  Date of LMP:   unknown  Contraception used: *Nexplanon   Indications:  The patient desires contraception.  She understands risks, benefits, and alternatives to Implanon and would like to proceed.  Anesthesia:   Lidocaine 1% plain.  Procedure:  A time-out was performed confirming the procedure and the patient's allergy status.  Complications: None                      The rod was palpated and the area was sterilely prepped.  The area beneath the distal tip was anesthetized with 1% xylocaine and the skin incised                       Over the tip and the tip was exposed, grasped with forcep and removed intact.  A single suture of 4-0 Vicryl was used to close incision.  Steri strip                       And a bandage applied and the arm was wrapped with gauze bandage.  The patient tolerated well.  Instructions:  The patient was instructed to remove the dressing in 24 hours and that some bruising is to be expected.  She was advised to use over the counter analgesics as needed for any pain at the site.  She is to keep the area dry for 24 hours and to call if her hand or arm becomes cold, numb, or blue.  Return visit:  Return in 2 weeks   Brock Bad MD 04-05-2018

## 2018-04-19 ENCOUNTER — Ambulatory Visit: Payer: Medicaid Other | Admitting: Obstetrics

## 2020-07-02 ENCOUNTER — Ambulatory Visit (INDEPENDENT_AMBULATORY_CARE_PROVIDER_SITE_OTHER): Payer: Medicaid Other

## 2020-07-02 VITALS — Ht 71.0 in

## 2020-07-02 DIAGNOSIS — Z3A10 10 weeks gestation of pregnancy: Secondary | ICD-10-CM

## 2020-07-02 DIAGNOSIS — Z3481 Encounter for supervision of other normal pregnancy, first trimester: Secondary | ICD-10-CM

## 2020-07-02 DIAGNOSIS — O0990 Supervision of high risk pregnancy, unspecified, unspecified trimester: Secondary | ICD-10-CM | POA: Insufficient documentation

## 2020-07-02 DIAGNOSIS — Z3480 Encounter for supervision of other normal pregnancy, unspecified trimester: Secondary | ICD-10-CM

## 2020-07-02 HISTORY — DX: Supervision of high risk pregnancy, unspecified, unspecified trimester: O09.90

## 2020-07-02 MED ORDER — BLOOD PRESSURE KIT DEVI: 1.0000 | 0 refills | Status: AC

## 2020-07-02 NOTE — Progress Notes (Signed)
  Virtual Visit via Telephone Note  I connected with Audrey Butler on 07/02/20 at  2:00 PM EST by telephone and verified that I am speaking with the correct person using two identifiers.  Location: Patient: home Provider: [facility]   I discussed the limitations, risks, security and privacy concerns of performing an evaluation and management service by telephone and the availability of in person appointments. I also discussed with the patient that there may be a patient responsible charge related to this service. The patient expressed understanding and agreed to proceed.   History of Present Illness: PRENATAL INTAKE SUMMARY  Ms. Slight presents today New OB Nurse Interview.  OB History    Gravida  2   Para  1   Term  1   Preterm      AB      Living  1     SAB      IAB      Ectopic      Multiple  0   Live Births  1          I have reviewed the patient's medical, obstetrical, social, and family histories, medications, and available lab results.  SUBJECTIVE She has no unusual complaints   Observations/Objective: Initial nurse interview for history/labs (New OB)  EDD: 01/25/2021 GA: [redacted]w[redacted]d G2P1 FHT: NA  GENERAL APPEARANCE: oriented to person, place and time  Assessment and Plan: Normal pregnancy Prenatal care: [facility] Labs to be completed at next visit with Dr. Valentina Shaggy on 07/05/2020 BP Cuff ordered Babyscripts ordered.   Follow Up Instructions:   I discussed the assessment and treatment plan with the patient. The patient was provided an opportunity to ask questions and all were answered. The patient agreed with the plan and demonstrated an understanding of the instructions.   The patient was advised to call back or seek an in-person evaluation if the symptoms worsen or if the condition fails to improve as anticipated.  I provided 15 minutes of non-face-to-face time during this encounter.   Maretta Bees, RMA

## 2020-07-05 ENCOUNTER — Other Ambulatory Visit (HOSPITAL_COMMUNITY)
Admission: RE | Admit: 2020-07-05 | Discharge: 2020-07-05 | Disposition: A | Discharge status: Home / Self Care | Payer: BLUE CROSS/BLUE SHIELD | Source: Ambulatory Visit | Attending: Obstetrics & Gynecology | Admitting: Obstetrics & Gynecology

## 2020-07-05 ENCOUNTER — Ambulatory Visit (INDEPENDENT_AMBULATORY_CARE_PROVIDER_SITE_OTHER): Payer: BC Managed Care – PPO | Admitting: Obstetrics & Gynecology

## 2020-07-05 ENCOUNTER — Other Ambulatory Visit: Payer: Self-pay

## 2020-07-05 ENCOUNTER — Encounter: Payer: Self-pay | Admitting: Obstetrics & Gynecology

## 2020-07-05 VITALS — BP 119/75 | HR 80 | Wt 222.0 lb

## 2020-07-05 DIAGNOSIS — B9689 Other specified bacterial agents as the cause of diseases classified elsewhere: Secondary | ICD-10-CM | POA: Insufficient documentation

## 2020-07-05 DIAGNOSIS — O23591 Infection of other part of genital tract in pregnancy, first trimester: Secondary | ICD-10-CM | POA: Insufficient documentation

## 2020-07-05 DIAGNOSIS — R87810 Cervical high risk human papillomavirus (HPV) DNA test positive: Secondary | ICD-10-CM | POA: Diagnosis not present

## 2020-07-05 DIAGNOSIS — O30001 Twin pregnancy, unspecified number of placenta and unspecified number of amniotic sacs, first trimester: Secondary | ICD-10-CM | POA: Diagnosis not present

## 2020-07-05 DIAGNOSIS — O30002 Twin pregnancy, unspecified number of placenta and unspecified number of amniotic sacs, second trimester: Secondary | ICD-10-CM | POA: Insufficient documentation

## 2020-07-05 DIAGNOSIS — Z3481 Encounter for supervision of other normal pregnancy, first trimester: Secondary | ICD-10-CM | POA: Diagnosis not present

## 2020-07-05 DIAGNOSIS — O283 Abnormal ultrasonic finding on antenatal screening of mother: Secondary | ICD-10-CM | POA: Diagnosis not present

## 2020-07-05 DIAGNOSIS — O09892 Supervision of other high risk pregnancies, second trimester: Secondary | ICD-10-CM | POA: Insufficient documentation

## 2020-07-05 DIAGNOSIS — O285 Abnormal chromosomal and genetic finding on antenatal screening of mother: Secondary | ICD-10-CM | POA: Diagnosis not present

## 2020-07-05 DIAGNOSIS — Z3A10 10 weeks gestation of pregnancy: Secondary | ICD-10-CM | POA: Diagnosis not present

## 2020-07-05 DIAGNOSIS — B373 Candidiasis of vulva and vagina: Secondary | ICD-10-CM | POA: Insufficient documentation

## 2020-07-05 DIAGNOSIS — Z3A32 32 weeks gestation of pregnancy: Secondary | ICD-10-CM | POA: Diagnosis not present

## 2020-07-05 DIAGNOSIS — Z124 Encounter for screening for malignant neoplasm of cervix: Secondary | ICD-10-CM | POA: Insufficient documentation

## 2020-07-05 DIAGNOSIS — O358XX0 Maternal care for other (suspected) fetal abnormality and damage, not applicable or unspecified: Secondary | ICD-10-CM | POA: Diagnosis not present

## 2020-07-05 DIAGNOSIS — Z3480 Encounter for supervision of other normal pregnancy, unspecified trimester: Secondary | ICD-10-CM | POA: Insufficient documentation

## 2020-07-05 DIAGNOSIS — O09893 Supervision of other high risk pregnancies, third trimester: Secondary | ICD-10-CM | POA: Insufficient documentation

## 2020-07-05 DIAGNOSIS — O98511 Other viral diseases complicating pregnancy, first trimester: Secondary | ICD-10-CM | POA: Diagnosis not present

## 2020-07-05 DIAGNOSIS — O09891 Supervision of other high risk pregnancies, first trimester: Secondary | ICD-10-CM | POA: Diagnosis not present

## 2020-07-05 DIAGNOSIS — Z315 Encounter for genetic counseling: Secondary | ICD-10-CM | POA: Diagnosis not present

## 2020-07-05 DIAGNOSIS — O99013 Anemia complicating pregnancy, third trimester: Secondary | ICD-10-CM

## 2020-07-05 DIAGNOSIS — O30043 Twin pregnancy, dichorionic/diamniotic, third trimester: Secondary | ICD-10-CM | POA: Diagnosis not present

## 2020-07-05 DIAGNOSIS — O0993 Supervision of high risk pregnancy, unspecified, third trimester: Secondary | ICD-10-CM | POA: Diagnosis not present

## 2020-07-05 HISTORY — DX: Twin pregnancy, unspecified number of placenta and unspecified number of amniotic sacs, second trimester: O30.002

## 2020-07-05 HISTORY — DX: Supervision of other high risk pregnancies, third trimester: O09.893

## 2020-07-05 MED ORDER — ASPIRIN EC 81 MG PO TBEC: 81.0000 mg | DELAYED_RELEASE_TABLET | Freq: Every day | ORAL | 11 refills | Status: DC

## 2020-07-05 MED ORDER — CITRANATAL BLOOM 90-1 MG PO TABS: 1.0000 | ORAL_TABLET | Freq: Every day | ORAL | 12 refills | Status: DC

## 2020-07-05 NOTE — Addendum Note (Signed)
Addended by: Cheree Ditto, Cynitha Berte A on: 07/05/2020 11:11 AM   Modules accepted: Orders

## 2020-07-05 NOTE — Progress Notes (Signed)
New OB Twin gestation, reports no problems today.

## 2020-07-05 NOTE — Progress Notes (Signed)
History:   Audrey Butler is a 25 y.o. G3P1011 at 42w6dby LMP being seen today for her first obstetrical visit.  Her obstetrical history is significant for gestational hypertension with her first pregnancy. Patient does intend to try breast feeding. Pregnancy history fully reviewed.  Patient reports nausea and no emesis.      HISTORY: OB History  Gravida Para Term Preterm AB Living  _0 0 1 1  SAB IAB Ectopic Multiple Live Births  0 0 0 0 1    # Outcome Date GA Lbr Len/2nd Weight Sex Delivery Anes PTL Lv  3 Current           2 Term 06/13/17 [id number]:30 / 00:44 6 lb 10 oz (3.005 kg) M Vag-Spont EPI  LIV     Birth Comments: WDL     Name: ROTye Savoy   Apgar1: 9  Apgar5: 9  1 AB             Last pap smear is unsure and was normal  Past Medical History:  Diagnosis Date  . Pregnancy induced hypertension    Past Surgical History:  Procedure Laterality Date  . NO PAST SURGERIES     No family history on file. Social History   Tobacco Use  . Smoking status: Never Smoker  . Smokeless tobacco: Never Used  Vaping Use  . Vaping Use: Never used  Substance Use Topics  . Alcohol use: No  . Drug use: No   No Known Allergies Current Outpatient Medications on File Prior to Visit  Medication Sig Dispense Refill  . Blood Pressure Monitoring (BLOOD PRESSURE KIT) DEVI 1 kit by Does not apply route once a week. Check Blood Pressure regularly and record readings into the Babyscripts App.  Large Cuff.  DX O90.0 1 each 0   No current facility-administered medications on file prior to visit.    Review of Systems Pertinent items noted in HPI and remainder of comprehensive ROS otherwise negative.  Physical Exam:   Vitals:   07/05/20 0951  BP: 119/75  Pulse: 80  Weight: 222 lb (100.7 kg)   Fetal Heart Rate (bpm): A:163 B:167 Bedside Ultrasound for FHR check: Viable intrauterine pregnancy with positive cardiac activity noted, fetal heart rate 155/158bpm Patient  informed that the ultrasound is considered a limited obstetric ultrasound and is not intended to be a complete ultrasound exam.  Patient also informed that the ultrasound is not being completed with the intent of assessing for fetal or placental anomalies or any pelvic abnormalities.  Explained that the purpose of today's ultrasound is to assess for fetal heart rate.  Patient acknowledges the purpose of the exam and the limitations of the study. General: well-developed, well-nourished female in no acute distress  Breasts:  normal appearance, no masses or tenderness bilaterally  Skin: normal coloration and turgor, no rashes  Neurologic: oriented, normal, negative, normal mood  Extremities: normal strength, tone, and muscle mass, ROM of all joints is normal  HEENT PERRLA, extraocular movement intact and sclera clear, anicteric  Neck supple and no masses  Cardiovascular: regular rate and rhythm  Respiratory:  no respiratory distress, normal breath sounds  Abdomen: soft, non-tender; bowel sounds normal; no masses,  no organomegaly  Pelvic: normal external genitalia, no lesions, normal vaginal mucosa, normal vaginal discharge, normal cervix, pap smear done. Uterine size:  12    Assessment:    Pregnancy: G3P1011 Patient Active Problem List   Diagnosis Date Noted  . Supervision  of other normal pregnancy, antepartum 07/02/2020     Plan:    1. [redacted] weeks gestation of pregnancy - return 4 weeks - start baby ASA after 12 weeks  2. Supervision of other normal pregnancy, antepartum - Cervicovaginal ancillary only( [place]) - [place] MFM OB DETAIL +14 WK; Future - CBC/D/Plt+RPR+Rh+ABO+Rub Ab... - Genetic Screening - Culture, OB Urine  3. Cervical cancer screening - Cytology - PAP( [place])  4. Twin gestation in first trimester, unspecified multiple gestation type - growth scans and additional possible increased monitoring discussed  5.  Prior pregnancy complicated by pregnancy induced  hypertension   Initial labs drawn. Continue prenatal vitamins. Problem list reviewed and updated. Genetic Screening discussed, NIPS: requested but will draw at next visit. Ultrasound discussed; fetal anatomic survey: requested. Anticipatory guidance about prenatal visits given including labs, ultrasounds, and testing. Discussed usage of Babyscripts and virtual visits as additional source of managing and completing prenatal visits in midst of coronavirus and pandemic.   Encouraged to complete MyChart Registration for her ability to review results, send requests, and have questions addressed.  The nature of [place] for [facility] with multiple MDs and Advanced Practice Providers was explained to patient; also emphasized that residents, students are part of our team. Routine obstetric precautions reviewed. Encouraged to seek out care at office or emergency room [facility] preferred) for urgent and/or emergent concerns. Return in about 4 weeks (around 08/02/2020).     Felipa Emory, MD, [place], [facility] for [facility], [place]

## 2020-07-08 LAB — CBC/D/PLT+RPR+RH+ABO+RUB AB...
Antibody Screen: NEGATIVE
Basophils Absolute: 0 10*3/uL (ref 0.0–0.2)
Basos: 0 %
EOS (ABSOLUTE): 0.1 10*3/uL (ref 0.0–0.4)
Eos: 1 %
HCV Ab: 0.1 s/co ratio (ref 0.0–0.9)
HIV Screen 4th Generation wRfx: NONREACTIVE
Hematocrit: 35.4 % (ref 34.0–46.6)
Hemoglobin: 11.5 g/dL (ref 11.1–15.9)
Hepatitis B Surface Ag: NEGATIVE
Immature Grans (Abs): 0 10*3/uL (ref 0.0–0.1)
Immature Granulocytes: 0 %
Lymphocytes Absolute: 1.1 10*3/uL (ref 0.7–3.1)
Lymphs: 23 %
MCH: 27.3 pg (ref 26.6–33.0)
MCHC: 32.5 g/dL (ref 31.5–35.7)
MCV: 84 fL (ref 79–97)
Monocytes Absolute: 0.4 10*3/uL (ref 0.1–0.9)
Monocytes: 8 %
Neutrophils Absolute: 3.3 10*3/uL (ref 1.4–7.0)
Neutrophils: 68 %
Platelets: 230 10*3/uL (ref 150–450)
RBC: 4.22 x10E6/uL (ref 3.77–5.28)
RDW: 12.9 % (ref 11.7–15.4)
RPR Ser Ql: NONREACTIVE
Rh Factor: POSITIVE
Rubella Antibodies, IGG: 10.1 index (ref >0.99)
WBC: 4.8 10*3/uL (ref 3.4–10.8)

## 2020-07-08 LAB — CERVICOVAGINAL ANCILLARY ONLY
Bacterial Vaginitis (gardnerella): POSITIVE — AB
Candida Glabrata: NEGATIVE
Candida Vaginitis: POSITIVE — AB
Chlamydia: NEGATIVE
Comment: NEGATIVE
Comment: NEGATIVE
Comment: NEGATIVE
Comment: NEGATIVE
Comment: NEGATIVE
Comment: NORMAL
Neisseria Gonorrhea: NEGATIVE
Trichomonas: NEGATIVE

## 2020-07-08 LAB — HCV INTERPRETATION

## 2020-07-09 LAB — URINE CULTURE, OB REFLEX

## 2020-07-09 LAB — CULTURE, OB URINE

## 2020-07-10 ENCOUNTER — Other Ambulatory Visit: Payer: Self-pay

## 2020-07-10 MED ORDER — METRONIDAZOLE 500 MG PO TABS: 500.0000 mg | ORAL_TABLET | Freq: Two times a day (BID) | ORAL | 0 refills | Status: DC

## 2020-07-10 MED ORDER — TERCONAZOLE 0.4 % VA CREA: TOPICAL_CREAM | VAGINAL | 0 refills | Status: DC

## 2020-07-12 ENCOUNTER — Other Ambulatory Visit: Payer: Self-pay

## 2020-07-12 DIAGNOSIS — Z3480 Encounter for supervision of other normal pregnancy, unspecified trimester: Secondary | ICD-10-CM

## 2020-07-12 LAB — CYTOLOGY - PAP
Adequacy: ABSENT
Comment: NEGATIVE
Comment: NEGATIVE
Diagnosis: NEGATIVE
HPV 16: NEGATIVE
HPV 18 / 45: NEGATIVE
High risk HPV: POSITIVE — AB

## 2020-07-12 MED ORDER — PREPLUS 27-1 MG PO TABS: 1.0000 | ORAL_TABLET | Freq: Every day | ORAL | 13 refills | Status: DC

## 2020-07-12 NOTE — Progress Notes (Signed)
Medicaid [employer] denied Audrey Butler  Sent a different PNV to pharmacy Pt is aware

## 2020-07-18 NOTE — Telephone Encounter (Signed)
Have asked RN at [facility] location to contact pt to see what specifically are her needs for leave from work.

## 2020-08-02 ENCOUNTER — Other Ambulatory Visit: Payer: Self-pay

## 2020-08-02 ENCOUNTER — Ambulatory Visit (INDEPENDENT_AMBULATORY_CARE_PROVIDER_SITE_OTHER): Payer: BLUE CROSS/BLUE SHIELD | Admitting: Obstetrics and Gynecology

## 2020-08-02 ENCOUNTER — Encounter: Payer: Self-pay | Admitting: Obstetrics and Gynecology

## 2020-08-02 VITALS — BP 122/80 | HR 106 | Wt 222.0 lb

## 2020-08-02 DIAGNOSIS — O30042 Twin pregnancy, dichorionic/diamniotic, second trimester: Secondary | ICD-10-CM

## 2020-08-02 DIAGNOSIS — Z3A14 14 weeks gestation of pregnancy: Secondary | ICD-10-CM

## 2020-08-02 DIAGNOSIS — O09892 Supervision of other high risk pregnancies, second trimester: Secondary | ICD-10-CM

## 2020-08-02 DIAGNOSIS — Z3480 Encounter for supervision of other normal pregnancy, unspecified trimester: Secondary | ICD-10-CM

## 2020-08-02 NOTE — Progress Notes (Signed)
ROB Twins [redacted]w[redacted]d  Per notes Genetic Screening today  Pt does not want to know gender however still wants gender noted on results.   Pt notes not taking baby asa yet.  CC: None

## 2020-08-02 NOTE — Progress Notes (Signed)
   PRENATAL VISIT NOTE  Subjective:  Audrey Butler is a 25 y.o. G3P1011 at [redacted]w[redacted]d being seen today for ongoing prenatal care.  She is currently monitored for the following issues for this high-risk pregnancy and has Supervision of other normal pregnancy, antepartum; Twin gestation in second trimester; Prior pregnancy complicated by PIH, antepartum, second trimester; and [redacted] weeks gestation of pregnancy on their problem list.  Patient doing well with no acute concerns today. She reports no complaints.  Contractions: Not present. Vag. Bleeding: None.  Movement: Present. Denies leaking of fluid.   The following portions of the patient's history were reviewed and updated as appropriate: allergies, current medications, past family history, past medical history, past social history, past surgical history and problem list. Problem list updated.  Objective:   Vitals:   08/02/20 0935  BP: 122/80  Pulse: (!) 106  Weight: 222 lb (100.7 kg)    Fetal Status: Fetal Heart Rate (bpm): left:154Rt:147   Movement: Present     General:  Alert, oriented and cooperative. Patient is in no acute distress.  Skin: Skin is warm and dry. No rash noted.   Cardiovascular: Normal heart rate noted  Respiratory: Normal respiratory effort, no problems with respiration noted  Abdomen: Soft, gravid, appropriate for gestational age.  Pain/Pressure: Absent     Pelvic: Cervical exam performed        Extremities: Normal range of motion.  Edema: None  Mental Status:  Normal mood and affect. Normal behavior. Normal judgment and thought content.   Assessment and Plan:  Pregnancy: G3P1011 at [redacted]w[redacted]d  1. Supervision of other normal pregnancy, antepartum  - Genetic Screening - Enroll Patient in Babyscripts  2.  twin pregnancy in second trimester Pt has scan 3/18, confirm chorionicity  3. Prior pregnancy complicated by PIH, antepartum, second trimester Start baby ASA  4. [redacted] weeks gestation of pregnancy   Preterm  labor symptoms and general obstetric precautions including but not limited to vaginal bleeding, contractions, leaking of fluid and fetal movement were reviewed in detail with the patient.  Please refer to After Visit Summary for other counseling recommendations.   Return in about 4 weeks (around 08/30/2020) for [facility], in person.   Mariel Aloe, MD Faculty Attending Center for [facility]

## 2020-08-02 NOTE — Addendum Note (Signed)
Addended by: Hamilton Capri on: 08/02/2020 10:36 AM   Modules accepted: Orders

## 2020-08-02 NOTE — Patient Instructions (Signed)
National Guideline Alliance ([place]).Twin and Triplet Pregnancy. [place]: National Institute for Health and Care Excellence (UK); 2019.">  Multiple Pregnancy Multiple pregnancy means that a woman is carrying more than one baby at a time. She may be pregnant with twins, triplets, or more. The majority of multiple pregnancies are twins. Naturally conceiving triplets or more (higher-order multiples) is rare. Multiple pregnancies are riskier than single pregnancies. A woman with a multiple pregnancy is more likely to have certain problems during her pregnancy. How does a multiple pregnancy happen? A multiple pregnancy happens when:  The woman's body releases more than one egg at a time, and then each egg gets fertilized by a different sperm. ? This is the most common type of multiple pregnancy. ? Twins or other multiples produced this way are called fraternal. They are no more alike than non-multiple siblings are.  One sperm fertilizes one egg, which then divides into more than one embryo. ? Twins or other multiples produced this way are called identical. Identical multiples are always the same gender, and they look very much alike.   Who is most likely to have a multiple pregnancy? A multiple pregnancy is more likely to develop in women who:  Have had fertility treatment, especially if the treatment included fertility medicines.  Are older than 24 years of age.  Have already had four or more children.  Have a family history of multiple pregnancy. How is a multiple pregnancy diagnosed? A multiple pregnancy may be diagnosed based on:  Symptoms such as: ? Rapid weight gain in the first 3 months of pregnancy (first trimester). ? More severe nausea and breast tenderness than what is typical of a single pregnancy. ? A larger uterus than what is normal for the stage of the pregnancy.  Blood tests that detect a higher-than-normal level of human chorionic gonadotropin (hCG). This is a hormone that your  body produces in early pregnancy.  An ultrasound exam. This is used to confirm that you are carrying multiples. What risks come with multiple pregnancy? A multiple pregnancy puts you at a higher risk for certain problems during or after your pregnancy. These include:  Delivering your babies before your due date (preterm birth). A full-term pregnancy lasts for at least 37 weeks. ? Babies born before 37 weeks may have a higher risk for breathing problems, feeding difficulties, cerebral palsy, and learning disabilities.  Diabetes.  Preeclampsia. This is a serious condition that causes high blood pressure and headaches during pregnancy.  Too much blood loss after childbirth (postpartum hemorrhage).  Postpartum depression.  Low birth weight of the babies. How will having a multiple pregnancy affect my care? Your health care team will monitor you more closely. You may need more frequent prenatal visits. This will ensure that you are healthy and that your babies are growing normally. Follow these instructions at home: Eating and drinking  Increase your nutrition. ? Follow your health care provider's recommendations for weight gain. You may need to gain a little extra weight when you are pregnant with multiples. ? Eat healthy snacks often throughout the day. This will add calories and reduce nausea.  Drink enough fluid to keep your urine pale yellow.  Take prenatal vitamins. Ask your health care provider what vitamins are right for you. Activity Limit your activities by 20-24 weeks of pregnancy.  Rest often.  Avoid activities, exercise, and work that take a lot of effort.  Ask your health care provider when you should stop having sex. General instructions  Do not   use any products that contain nicotine or tobacco, such as cigarettes, e-cigarettes, and chewing tobacco. If you need help quitting, ask your health care provider.  Do not drink alcohol or use illegal drugs.  Take  over-the-counter and prescription medicines only as told by your health care provider.  Arrange for extra help around the house.  Keep all follow-up visits and all prenatal visits as told by your health care provider. This is important. Where to find more information  American College of Obstetricians and Gynecology: www.acog.org Contact a health care provider if:  You have dizziness.  You have nausea, vomiting, or diarrhea that does not go away.  You have depression or other emotions that are interfering with your normal activities.  You have a fever.  You have pain with urination.  You have a bad-smelling vaginal discharge.  You notice increased swelling in your face, hands, legs, or ankles. Get help right away if:  You have fluid leaking from your vagina.  You have bleeding from your vagina.  You have pelvic cramps, pelvic pressure, or nagging pain in your abdomen or lower back.  You are having regular contractions.  You have a severe headache, with or without changes in how you see.  You have chest pain or shortness of breath.  You notice that your babies move less often, or do not move at all. Summary  Having a multiple pregnancy means that a woman is carrying more than one baby at a time.  A multiple pregnancy puts you at a higher risk for delivering your babies before your due date, having diabetes, preeclampsia, too much blood loss after childbirth, or low birth weight of the babies.  Your health care provider will monitor you more closely during your pregnancy.  You may need to make some lifestyle changes during pregnancy. This includes eating more, limiting your activities after 20-24 weeks of pregnancy, and arranging for extra help around the house.  Follow up with your health care provider as instructed if you experience any complications. This information is not intended to replace advice given to you by your health care provider. Make sure you discuss  any questions you have with your health care provider. Document Revised: 01/23/2019 Document Reviewed: 01/23/2019 Elsevier Patient Education  2021 Elsevier Inc.  

## 2020-08-12 ENCOUNTER — Encounter: Payer: Self-pay | Admitting: Obstetrics and Gynecology

## 2020-08-20 ENCOUNTER — Encounter: Payer: Self-pay | Admitting: Obstetrics and Gynecology

## 2020-08-30 ENCOUNTER — Ambulatory Visit (HOSPITAL_BASED_OUTPATIENT_CLINIC_OR_DEPARTMENT_OTHER): Payer: BC Managed Care – PPO | Admitting: Genetic Counselor

## 2020-08-30 ENCOUNTER — Other Ambulatory Visit: Payer: Self-pay | Admitting: *Deleted

## 2020-08-30 ENCOUNTER — Ambulatory Visit: Payer: BC Managed Care – PPO | Attending: Obstetrics & Gynecology

## 2020-08-30 ENCOUNTER — Ambulatory Visit: Payer: BC Managed Care – PPO | Admitting: *Deleted

## 2020-08-30 ENCOUNTER — Encounter: Payer: BLUE CROSS/BLUE SHIELD | Admitting: Obstetrics & Gynecology

## 2020-08-30 ENCOUNTER — Encounter: Payer: Self-pay | Admitting: *Deleted

## 2020-08-30 ENCOUNTER — Other Ambulatory Visit: Payer: Self-pay

## 2020-08-30 VITALS — BP 115/68 | HR 89

## 2020-08-30 DIAGNOSIS — O283 Abnormal ultrasonic finding on antenatal screening of mother: Secondary | ICD-10-CM | POA: Diagnosis not present

## 2020-08-30 DIAGNOSIS — O358XX0 Maternal care for other (suspected) fetal abnormality and damage, not applicable or unspecified: Secondary | ICD-10-CM

## 2020-08-30 DIAGNOSIS — Z315 Encounter for genetic counseling: Secondary | ICD-10-CM

## 2020-08-30 DIAGNOSIS — O30002 Twin pregnancy, unspecified number of placenta and unspecified number of amniotic sacs, second trimester: Secondary | ICD-10-CM | POA: Insufficient documentation

## 2020-08-30 DIAGNOSIS — O285 Abnormal chromosomal and genetic finding on antenatal screening of mother: Secondary | ICD-10-CM | POA: Diagnosis not present

## 2020-08-30 DIAGNOSIS — O09891 Supervision of other high risk pregnancies, first trimester: Secondary | ICD-10-CM | POA: Diagnosis not present

## 2020-08-30 DIAGNOSIS — Z3480 Encounter for supervision of other normal pregnancy, unspecified trimester: Secondary | ICD-10-CM

## 2020-08-30 DIAGNOSIS — IMO0002 Reserved for concepts with insufficient information to code with codable children: Secondary | ICD-10-CM

## 2020-08-30 DIAGNOSIS — O30042 Twin pregnancy, dichorionic/diamniotic, second trimester: Secondary | ICD-10-CM

## 2020-08-30 DIAGNOSIS — O30001 Twin pregnancy, unspecified number of placenta and unspecified number of amniotic sacs, first trimester: Secondary | ICD-10-CM | POA: Diagnosis not present

## 2020-08-30 DIAGNOSIS — Z3A10 10 weeks gestation of pregnancy: Secondary | ICD-10-CM | POA: Insufficient documentation

## 2020-08-30 NOTE — Progress Notes (Addendum)
ADDENDUM 09/02/20: I called Audrey Butler to check in following her genetic counseling appointment last week. She informed me that after having some time to process, she is not interested in pursuing amniocentesis and would rather proceed with monitoring the pregnancy via ultrasound. She did express interest in having another formal genetic counseling appointment to discuss Down syndrome in more detail. She would like to meet following her next ultrasound in MFM on 4/14. We scheduled her for this appointment. 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 She confirmed that she had no further questions at this time.  -----------------------------------------------------------------------------------------------------------------------  08/30/2020  [name] [birth year] MRN: [id number] DOV: 08/30/2020  Audrey Butler presented to the [facility] for Maternal Fetal Care for a genetics consultation regarding noninvasive prenatal screening (NIPS) results that were high-risk for trisomy 21. Audrey Butler was accompanied to her appointment by her partner.   Indication for genetic counseling - NIPS high-risk for trisomy 19  Prenatal history  [name] is a G71P1011, 25 y.o. female. Her current pregnancy has completed [redacted]w[redacted]d(Estimated Date of Delivery: 01/25/21). Prenatal history was not reviewed in detail today as this was a brief consultation.  Family History  Family history information was not reviewed in detail today as this was a brief consultation. A three generation pedigree was not drafted.  Discussion  I briefly met with Audrey Butler at Dr. BShon Batonrequest following her anatomy ultrasound performed today. Ms. RSweaneyhad [facility] noninvasive prenatal screening (NIPS) that was high-risk for trisomy 252AKA Down syndrome. This is a di/di twin pregnancy and NIPS confirmed that the fetuses are likely dizygotic. On today's ultrasound, soft markers for  Down syndrome were identified in twin A.  Down syndrome:   We briefly reviewed information about Down syndrome. There are different types of Down syndrome, with each type determined by the arrangement of the chromosome 21 pair. Most cases are caused by an entire extra copy of chromosome 21 (trisomy 21). We reviewed that Down syndrome most commonly occurs by chance.    Down syndrome is characterized by a distinctive facial appearance, mild to moderate intellectual disability, and an increased chance for a heart defect. Approximately half of babies with Down syndrome are born with a heart defect that may require surgery after birth. While many children with Down syndrome look similar to each other, each child with Down syndrome is unique and will have many more features in common with his or her own family members. We discussed that there are many more features that can be associated with Down syndrome; however, it is not possible to accurately predict all features that would be present in an individual with Down syndrome prenatally. Additionally, there is a high degree of variability seen among children who have this condition, meaning that every child with Down syndrome will not be affected in exactly the same way, and some children will have more or less features than others. With the advances in medical technology, early intervention, and supportive therapies, many individuals with Down syndrome are able to live with an increasing degree of independence. Today, many adults with Down syndrome care for themselves, have jobs, and often live in group homes or apartments where assistance is available if needed.  NIPS result:   We reviewed that NIPS analyzes cell free DNA originating from the placenta that is found in the maternal blood circulation during pregnancy. This test can provide information regarding the presence or absence of extra fetal DNA for chromosomes 13, 18  and 21 as well as the sex  chromosomes. The reported detection rate is greater than 99% for trisomy 21, greater than 99% for trisomy 18, greater than 91% for trisomy 13, and greater than 94% for monosomy X. However, it cannot be considered diagnostic for chromosome conditions. Positive predictive value (PPV) is the probability that a pregnancy with a positive test result is truly affected. The PPV reported by the NIPS laboratory for trisomy 21 in the current pregnancy was estimated to be 70%. The PPV calculator offered by the [place] estimated PPV for trisomy 21 in the current pregnancy to be 50%. Thus, there is a 50-70% chance that this could be a true positive result. Since this is a dizygotic twin pregnancy, it is likely that the risk for Down syndrome only applies to one of the fetuses. However, further testing would be required to rule out Down syndrome in the other twin.   Audrey Butler was counseled that there are several possible explanations for her high risk NIPS result. Firstly, one (or both) of the fetuses could truly be affected by Down syndrome. Secondly, one (or both) of the fetuses could be mosaic for Down syndrome, though this is rare. Mosaicism occurs when an individual has two or more genetically different sets of cells in their body. Individuals who are mosaic for Down syndrome have some cells in the body with trisomy 21 and may have other cells that are chromosomally normal or have another chromosomal makeup. We discussed that it would not be possible to tell which features an individual with mosaic Down syndrome may have, as it is impossible to assess which specific cells and tissues in the body have Down syndrome. Individuals who are mosaic for Down syndrome may be more mildly affected than individuals with full trisomy 21, though this is not always the case. Thirdly, one (or both) of the placentas could have trisomy 21 while the fetus(es) could be  unaffected. case. This is a phenomenon known as confined placental mosaicism (CPM). Lastly, this could be a false positive result.  Ultrasound findings:  A complete ultrasound was performed today prior to our visit. The ultrasound report will be sent under separate cover. An echogenic intracardiac focus (EIF) and echogenic bowel were noted in twin A. The couple had been counseled on these findings by Dr. Gertie Exon.  An EIF is a bright spot seen in the heart on ultrasound. EIF is considered a soft marker for Down syndrome. EIF is present in ~3-5% of chromosomally normal fetuses, but up to 30% of fetuses with Down syndrome. Echogenic bowel refers to an area in the fetal intestines that appears brighter, or denser, than the liver and/or bone on ultrasound. Echogenic bowel is detected in ~0.2-1.8% of fetuses at the time of a second trimester ultrasound evaluation. Chromosomal aneuploidies including trisomy 21 account for 4-25% of all cases of echogenic bowel.   Given the identified soft markers in combination with the NIPS result, the couple was counseled that there is an increased likelihood that fetus A has Down syndrome.   Diagnostic testing:   Ms. Top was counseled regarding diagnostic testing via amniocentesis. Amniocentesis is an option beginning at 59 weeks' gestation. We discussed the technical aspects of the procedure and quoted up to a 1 in 500 (0.2%) risk for spontaneous pregnancy loss or other adverse pregnancy outcomes as a result of amniocentesis. Cultured cells from an amniocentesis sample allow for the visualization of a fetal karyotype, which  can detect >99% of large chromosomal aberrations, including trisomy 21. Chromosomal microarray can also be performed to identify smaller deletions or duplications of fetal chromosomal material. Ms. Ficken was informed that diagnostic testing would be the only way to definitively determine if the fetus(es) are affected by Down syndrome prenatally. If  Ms. Waren elects to have an amniocentesis, it would be recommended that a sample is drawn from each of the twins' sacs to ensure that a diagnosis would not be missed by only testing one twin.  We discussed that if Ms. Kooi elected to have an amniocentesis and results were to come back positive, it could impact pregnancy management in several different ways. We discussed that some individuals may choose to end a pregnancy or consider adoption if a diagnosis of Down syndrome were confirmed in a fetus. The couple was informed that it is an option to end a pregnancy until 20-21 weeks' gestation in the state of [place] depending on the clinic. Termination is still an option beyond 21 weeks' gestation in other states. For individuals who would not alter their pregnancy management regardless of testing outcomes, a prenatal diagnosis could allow for delivery planning and prenatal consults with specialists that would be involved in the infant's care, as well as time to plan and prepare emotionally, physically, and financially. Additionally, diagnostic testing can help to inform recurrence risks for future pregnancies. Diagnostic testing could also aid in determining the level of mosaicism that is present if one of the fetuses is mosaic for Down syndrome.   Ms. Capri was also made aware that she has the option of continuing with standard ultrasounds and pursuing genetic testing postnatally. A fetal echocardiogram for a deeper assessment of cardiac defects is recommended given the increased risk for congenital heart defects associated with Down syndrome.  Plan:  Ms. Mcmahan was understandably overwhelmed and emotional today. I validated that it is normal to experience a variety of emotions in response to the information that was presented and that I understood it was difficult to absorb it all. Ms. Logie was unable to make any further decisions today, so I offered to call her next week to check in  after she has had some time to process the information she was given. Ms. Gouin appreciated this plan and verified that receiving information about Down syndrome and amniocentesis that she could take home with her to review on her own time would be helpful. I provided her with information packets on Down syndrome from [employer] and [employer] as well as an information packet on amniocentesis. I will call Ms. Jubb early next week.  Of note, Ms. 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 I will counsel her on this at a future time.  I counseled Ms. Kommer regarding the above risks and available options. The approximate face-to-face time with the genetic counselor was 25 minutes.  In summary:  Discussed NIPS result  NIPS high-risk for Down syndrome (likely in one twin given twins are dizygotic)  Briefly reviewed Down syndrome, including most common causes & features  Reviewed results of ultrasound  Soft markers (echogenic intracardiac focus & echogenic bowel) identified in twin A  Increases likelihood that twin A has Down syndrome  Offered additional testing and screening  Offered amniocentesis vs ultrasound monitoring & postnatal testing. I will contact patient next week to see if she's decided about further testing  Recommend fetal echocardiogram. We will place referral   Buelah Manis, MS, [place]

## 2020-09-06 ENCOUNTER — Ambulatory Visit (INDEPENDENT_AMBULATORY_CARE_PROVIDER_SITE_OTHER): Payer: BC Managed Care – PPO | Admitting: Obstetrics & Gynecology

## 2020-09-06 ENCOUNTER — Other Ambulatory Visit: Payer: Self-pay

## 2020-09-06 ENCOUNTER — Encounter: Payer: Self-pay | Admitting: Obstetrics & Gynecology

## 2020-09-06 VITALS — BP 112/73 | HR 80 | Wt 225.0 lb

## 2020-09-06 DIAGNOSIS — O30042 Twin pregnancy, dichorionic/diamniotic, second trimester: Secondary | ICD-10-CM

## 2020-09-06 DIAGNOSIS — O285 Abnormal chromosomal and genetic finding on antenatal screening of mother: Secondary | ICD-10-CM

## 2020-09-06 DIAGNOSIS — Z3480 Encounter for supervision of other normal pregnancy, unspecified trimester: Secondary | ICD-10-CM

## 2020-09-06 NOTE — Patient Instructions (Signed)

## 2020-09-06 NOTE — Progress Notes (Signed)
   PRENATAL VISIT NOTE  Subjective:  Audrey Butler is a 25 y.o. G3P1011 at [redacted]w[redacted]d being seen today for ongoing prenatal care.  She is currently monitored for the following issues for this high-risk pregnancy and has Supervision of other normal pregnancy, antepartum; Twin gestation in second trimester; Prior pregnancy complicated by PIH, antepartum, second trimester; and [redacted] weeks gestation of pregnancy on their problem list.  Patient reports no complaints.  Contractions: Not present. Vag. Bleeding: None.  Movement: Present. Denies leaking of fluid.   The following portions of the patient's history were reviewed and updated as appropriate: allergies, current medications, past family history, past medical history, past social history, past surgical history and problem list.   Objective:   Vitals:   09/06/20 0902  BP: 112/73  Pulse: 80  Weight: 225 lb (102.1 kg)    Fetal Status: Fetal Heart Rate (bpm): A:156 B:132   Movement: Present     General:  Alert, oriented and cooperative. Patient is in no acute distress.  Skin: Skin is warm and dry. No rash noted.   Cardiovascular: Normal heart rate noted  Respiratory: Normal respiratory effort, no problems with respiration noted  Abdomen: Soft, gravid, appropriate for gestational age.  Pain/Pressure: Absent     Pelvic: Cervical exam deferred        Extremities: Normal range of motion.  Edema: None  Mental Status: Normal mood and affect. Normal behavior. Normal judgment and thought content.   Assessment and Plan:  Pregnancy: G3P1011 at [redacted]w[redacted]d 1. Supervision of other normal pregnancy, antepartum [place] markers HR discuse - AFP, Serum, Open Spina Bifida - CMV IgM - Toxoplasma gondii antibody, IgM  2. Dichorionic diamniotic twin pregnancy in second trimester Abnormal NIPS, [place] markers  3. Abnormal chromosomal and genetic finding on antenatal screening mother Increased DSR  Preterm labor symptoms and general obstetric precautions including  but not limited to vaginal bleeding, contractions, leaking of fluid and fetal movement were reviewed in detail with the patient. Please refer to After Visit Summary for other counseling recommendations.   Return in about 4 weeks (around 10/04/2020).  Future Appointments  Date Time Provider Department Center  09/26/2020  1:15 PM [facility] NURSE [facility]  09/26/2020  1:30 PM [facility] US3 [facility]  09/26/2020  2:30 PM [facility] GENETIC COUNSELING [facility]    Scheryl Darter, MD

## 2020-09-06 NOTE — Progress Notes (Signed)
Pt reports fetal movement, denies pain.  

## 2020-09-08 LAB — AFP, SERUM, OPEN SPINA BIFIDA
AFP MoM: 3.18
AFP Value: 137.2 ng/mL
Gest. Age on Collection Date: 19 weeks
Maternal Age At EDD: 24.5 yr
OSBR Risk 1 IN: 608
Test Results:: NEGATIVE
Weight: 225 [lb_av]

## 2020-09-08 LAB — TOXOPLASMA GONDII ANTIBODY, IGM: Toxoplasma Antibody- IgM: <3 AU/mL (ref 0.0–7.9)

## 2020-09-08 LAB — CMV IGM: CMV IgM Ser EIA-aCnc: <30 AU/mL (ref 0.0–29.9)

## 2020-09-08 LAB — INFECT DISEASE AB IGM REFLEX 1

## 2020-09-26 ENCOUNTER — Ambulatory Visit: Payer: BC Managed Care – PPO | Admitting: *Deleted

## 2020-09-26 ENCOUNTER — Ambulatory Visit (HOSPITAL_BASED_OUTPATIENT_CLINIC_OR_DEPARTMENT_OTHER): Payer: BC Managed Care – PPO | Admitting: Genetic Counselor

## 2020-09-26 ENCOUNTER — Ambulatory Visit: Payer: BC Managed Care – PPO | Attending: Maternal & Fetal Medicine

## 2020-09-26 ENCOUNTER — Other Ambulatory Visit: Payer: Self-pay

## 2020-09-26 ENCOUNTER — Encounter: Payer: Self-pay | Admitting: *Deleted

## 2020-09-26 VITALS — BP 130/68 | HR 101

## 2020-09-26 DIAGNOSIS — O280 Abnormal hematological finding on antenatal screening of mother: Secondary | ICD-10-CM

## 2020-09-26 DIAGNOSIS — E669 Obesity, unspecified: Secondary | ICD-10-CM | POA: Diagnosis not present

## 2020-09-26 DIAGNOSIS — Z315 Encounter for genetic counseling: Secondary | ICD-10-CM

## 2020-09-26 DIAGNOSIS — O99212 Obesity complicating pregnancy, second trimester: Secondary | ICD-10-CM

## 2020-09-26 DIAGNOSIS — O30042 Twin pregnancy, dichorionic/diamniotic, second trimester: Secondary | ICD-10-CM

## 2020-09-26 DIAGNOSIS — Z363 Encounter for antenatal screening for malformations: Secondary | ICD-10-CM | POA: Diagnosis not present

## 2020-09-26 DIAGNOSIS — D563 Thalassemia minor: Secondary | ICD-10-CM | POA: Diagnosis not present

## 2020-09-26 DIAGNOSIS — Z8759 Personal history of other complications of pregnancy, childbirth and the puerperium: Secondary | ICD-10-CM

## 2020-09-26 DIAGNOSIS — O281 Abnormal biochemical finding on antenatal screening of mother: Secondary | ICD-10-CM

## 2020-09-26 DIAGNOSIS — O321XX2 Maternal care for breech presentation, fetus 2: Secondary | ICD-10-CM

## 2020-09-26 DIAGNOSIS — Z148 Genetic carrier of other disease: Secondary | ICD-10-CM

## 2020-09-26 DIAGNOSIS — Z3A22 22 weeks gestation of pregnancy: Secondary | ICD-10-CM

## 2020-09-26 NOTE — Progress Notes (Signed)
09/26/2020  Audrey Butler [birth year] MRN: [id number] DOV: 09/26/2020  Audrey Butler presented to the [facility] for Maternal Fetal Care for a genetics consultation regarding her carrier status for alpha-thalassemia. Audrey Butler was accompanied to her appointment by [name], Audrey Butler.   Indication for genetic counseling - Silent carrier for alpha-thalassemia  Prenatal history  Audrey Butler is a G95P1011, 25 y.o. female. Her current pregnancy has completed [redacted]w[redacted]d (Estimated Date of Delivery: 01/25/21). Audrey Butler has a three year old son from a prior relationship. She has also had one prior elective termination. The current fetuses are dizygotic twin males.  Audrey Butler denied exposure to environmental toxins or chemical agents. She denied the use of alcohol, tobacco or street drugs. She reported taking prenatal vitamins. She denied significant fevers and bleeding during the course of her pregnancy. She reported having COVID around 13 weeks' gestation. Her main symptom was a cough. Her medical and surgical histories were noncontributory.  Family History  A three generation pedigree was drafted and reviewed. The family history is remarkable for the following:  - Audrey Butler's maternal half brother had a son with medical problems from birth who died around age 35. Audrey Butler knew he had a tracheostomy tube, but had limited further details on this child. Audrey Butler Butler also has a paternal uncle who had a daughter who died around age 95. It was believed that this child had some problems with her throat; however, the exact cause of her death was unknown. Because of the limited details on these children, risk assessment is limited.  The remaining family histories were reviewed and found to be noncontributory for birth defects, intellectual disability, recurrent pregnancy loss, and known genetic conditions.    The patient's ancestry is [place] [address] and Native [address]  ([place]). The father of the pregnancy's ancestry is [place] American, Native American, and Caucasian. Consanguinity was denied. The couple was uncertain if they have Ashkenazi Jewish ancestry. Pedigree will be scanned under Media.  Discussion  Alpha-thalassemia:  Audrey Butler returned for genetic counseling to discuss her carrier status for alpha-thalassemia since this was not reviewed during her last genetic counseling appointment (see Genetic Counseling note from 08/30/20). We discussed that Audrey Butler had Horizon-4 carrier screening performed through [place]. 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   Alpha-thalassemia is different in its inheritance compared to other hemoglobinopathies as there are two copies of two alpha globin genes (HBA1 and HBA2) on each chromosome 16, or four alpha globin genes total (aa/aa). A person can be a carrier of one alpha gene mutation (aa/a-), also referred to as a "silent carrier". A person who carries two alpha globin gene mutations can either carry them in cis (both on the same chromosome, denoted as aa/--) or in trans (on different chromosomes, denoted as a-/a-). Alpha-thalassemia carriers of two mutations who have African American ancestry are more likely to have a trans arrangement (a-/a-); cis configuration is reported to be rare in individuals with African American ancestry.     There are several different forms of alpha-thalassemia. The most severe form of alpha-thalassemia, Hb Barts, is associated with an absence of alpha globin chain synthesis as a result of deletions of all four alpha globin genes (--/--).  Given that Audrey Butler is a silent carrier (aa/a-), her pregnancies would not be at increased risk for Hb Barts, even if [name] is a carrier for alpha-thalassemia, as she will always pass on at least one copy  of the alpha globin gene to her children. Hemoglobin H (HbH) disease is caused by three  deleted or dysfunctioning alpha globin alleles (a-/--) and is characterized by microcytic hypochromic hemolytic anemia, hepatosplenomegaly, mild jaundice, growth retardation, and sometimes thalassemia-like bone changes. Given Audrey Butler's silent carrier status (aa/a-), the current fetus would only be at risk for HbH disease (a-/--), if [name] is a carrier for two alpha globin mutations in cis (aa/--). If this is the case, the risk for HbH disease in the pregnancy would be 1 in 4 (25%). However, if Audrey Butler is a carrier for two alpha globin mutations, he would be more likely to carry them in trans configuration (a-/a-) than the cis configuration (aa/--), given his ethnicity. If he is a carrier of alpha-thalassemia in trans, then the pregnancy would not be at increased risk for HbH disease. Based on the carrier frequency for alpha-thalassemia in the African American population, Audrey Butler has a 1 in 30 chance of being any type of carrier for alpha-thalassemia. This means that the couple's chances of having a baby with HbH disease is <1%.  Audrey Butler carrier screening was negative for the other 3 conditions screened. Thus, her risk to be a carrier for these additional conditions (listed separately in the laboratory report) has been reduced but not eliminated. This also significantly reduces her risk of having a child affected by one of these conditions. We discussed that carrier testing for alpha-thalassemia is recommended for [name]'s Butler. The couple indicated that they are not interested in pursuing Butler carrier screening, as they feel comfortable with their estimated risk for HbH disease.  NIPS result:  I also checked in with the couple to see how they were feeling about their noninvasive prenatal screening (NIPS) result. [name] and [name] previously had genetic counseling for the NIPS result that was high-risk for trisomy 39 in one of the fetuses  (see Genetic Counseling note for more details). The couple informed me that they are feeling okay and are taking things day by day. Ms. Bolen confirmed that she is still not interested in amniocentesis during the pregnancy and prefers to wait until the postnatal period to pursue genetic testing. She has her echocardiogram scheduled on 6/1. I offered additional information and resources on trisomy 21 to the couple, which they declined at this time.  Plan:  Additional screening and diagnostic testing were declined today. Ms. Jorden and [name] are comfortable with their chances of having a baby with HbH disease and do not wish to pursue Butler carrier screening for alpha-thalassemia at this time. The couple understands that screening tests, including ultrasound, cannot rule out all birth defects or genetic syndromes. I encouraged them to contact me if they change their minds about testing, or if they would like any additional trisomy 21-related resources.  I counseled Ms. Pare regarding the above risks and available options. The approximate face-to-face time with the genetic counselor was 20 minutes.  In summary:  Discussed carrier screening results and options for follow-up testing  Silent carrier for alpha-thalassemia  Declined Butler carrier screening  Offered additional testing and screening  Declined amniocentesis  Would like to pursue postnatal testing to determine if fetus A has trisomy 14  Reviewed family history concerns   Gershon Crane, MS, Aeronautical engineer

## 2020-09-26 NOTE — Progress Notes (Unsigned)
MFM consultation  Diamniotic Dichorionic twins here for follow up growth with abnormal T21 NIPS and echogenic bowel in Twin A.  The follow up anatomy appears normal with good amniotic fluid and fetal movement was observed in Twin A and B.  There was no evidence of echogenic bowel or EIF seen today  Twin A EFW 17%  Twin B EFW 25% 4% growth discordance.  I reviewed the normal nature of today's ultrasound. Ms. Kardell conveyed that she is taking low dose aspirin for preeclampsia prevention.  She is meeting with our genetic counselor again today  I reiterated our prior counseling regarding the increased risk for T21. We discussed that the only diagnostic test is an amniocentesis. She and her significant other conveyed that they do not desire to perform a diagnostic test today.  I also reported that her labs were normal for CMV and toxo was normal.  We reviewed the sonographic findings and limitations of ultrasound. The potential risks associated with a twin gestation were discussed.  This discussion included a review of the increased risk of miscarriages, anomalies, preterm labor, and/or delivery, malpresentation, delivery via cesarean section, gestational diabetes, and/or preeclampsia.  With regards to fetal risks, there is an increased risk for fetal growth restriction of one or both twins, preterm labor, and associated morbidity, and intrauterine fetal demise.    We recommend growth scans every 4 weeks starting at 24 weeks with the initation of weekly antenatal testing in the form of twice weekly NST or weekly BPP should abnormal fetal growth or intertwin discordance of greater than 20-25% is noted.   Following counseling, all questions were addressed.    I spent 30 minutes with > 50% in face to face consultation.  Novella Olive, MD

## 2020-09-30 ENCOUNTER — Other Ambulatory Visit: Payer: Self-pay | Admitting: *Deleted

## 2020-09-30 DIAGNOSIS — O30042 Twin pregnancy, dichorionic/diamniotic, second trimester: Secondary | ICD-10-CM

## 2020-10-04 ENCOUNTER — Telehealth: Payer: Self-pay

## 2020-10-04 ENCOUNTER — Other Ambulatory Visit: Payer: Self-pay

## 2020-10-04 ENCOUNTER — Ambulatory Visit (INDEPENDENT_AMBULATORY_CARE_PROVIDER_SITE_OTHER): Payer: BC Managed Care – PPO | Admitting: Obstetrics & Gynecology

## 2020-10-04 ENCOUNTER — Encounter: Payer: Self-pay | Admitting: Obstetrics & Gynecology

## 2020-10-04 VITALS — BP 116/74 | HR 93 | Wt 227.0 lb

## 2020-10-04 DIAGNOSIS — O09892 Supervision of other high risk pregnancies, second trimester: Secondary | ICD-10-CM

## 2020-10-04 DIAGNOSIS — O30042 Twin pregnancy, dichorionic/diamniotic, second trimester: Secondary | ICD-10-CM

## 2020-10-04 DIAGNOSIS — O0992 Supervision of high risk pregnancy, unspecified, second trimester: Secondary | ICD-10-CM

## 2020-10-04 DIAGNOSIS — Z3A23 23 weeks gestation of pregnancy: Secondary | ICD-10-CM

## 2020-10-04 NOTE — Patient Instructions (Addendum)
Return to office for any scheduled appointments. Call the office or go to the MAU at [facility] at [facility] if:  You begin to have strong, frequent contractions  Your water breaks.  Sometimes it is a big gush of fluid, sometimes it is just a trickle that keeps getting your panties wet or running down your legs  You have vaginal bleeding.  It is normal to have a small amount of spotting if your cervix was checked.   You do not feel your babies moving like normal.  If you do not, get something to eat and drink and lay down and focus on feeling your baby move.   If your babies are still not moving like normal, you should call the office or go to MAU.  Any other obstetric concerns.  TDaP Vaccine Pregnancy Get the Whooping Cough Vaccine While You Are Pregnant (CDC)  It is important for women to get the whooping cough vaccine in the third trimester of each pregnancy. Vaccines are the best way to prevent this disease. There are 2 different whooping cough vaccines. Both vaccines combine protection against whooping cough, tetanus and diphtheria, but they are for different age groups: Tdap: for everyone 11 years or older, including pregnant women  DTaP: for children 2 months through 37 years of age  You need the whooping cough vaccine during each of your pregnancies The recommended time to get the shot is during your 27th through 36th week of pregnancy, preferably during the earlier part of this time period. The Centers for Disease Control and Prevention (CDC) recommends that pregnant women receive the whooping cough vaccine for adolescents and adults (called Tdap vaccine) during the third trimester of each pregnancy. The recommended time to get the shot is during your 27th through 36th week of pregnancy, preferably during the earlier part of this time period. This replaces the original recommendation that pregnant women get the vaccine only if they had not previously received it. The  [employer] and the [employer] support this recommendation.  You should get the whooping cough vaccine while pregnant to pass protection to your baby frame support disabled and/or not supported in this browser  Learn why Vernona Rieger decided to get the whooping cough vaccine in her 3rd trimester of pregnancy and how her baby girl was born with some protection against the disease. Also available on YouTube. After receiving the whooping cough vaccine, your body will create protective antibodies (proteins produced by the body to fight off diseases) and pass some of them to your baby before birth. These antibodies provide your baby some short-term protection against whooping cough in early life. These antibodies can also protect your baby from some of the more serious complications that come along with whooping cough. Your protective antibodies are at their highest about 2 weeks after getting the vaccine, but it takes time to pass them to your baby. So the preferred time to get the whooping cough vaccine is early in your third trimester. The amount of whooping cough antibodies in your body decreases over time. That is why CDC recommends you get a whooping cough vaccine during each pregnancy. Doing so allows each of your babies to get the greatest number of protective antibodies from you. This means each of your babies will get the best protection possible against this disease.  Getting the whooping cough vaccine while pregnant is better than getting the vaccine after you give birth Whooping cough vaccination during  pregnancy is ideal so your baby will have short-term protection as soon as he is born. This early protection is important because your baby will not start getting his whooping cough vaccines until he is 2 months old. These first few months of life are when your baby is at greatest risk for catching whooping cough. This is also when he's at  greatest risk for having severe, potentially life-threating complications from the infection. To avoid that gap in protection, it is best to get a whooping cough vaccine during pregnancy. You will then pass protection to your baby before he is born. To continue protecting your baby, he should get whooping cough vaccines starting at 2 months old. You may never have gotten the Tdap vaccine before and did not get it during this pregnancy. If so, you should make sure to get the vaccine immediately after you give birth, before leaving the hospital or birthing center. It will take about 2 weeks before your body develops protection (antibodies) in response to the vaccine. Once you have protection from the vaccine, you are less likely to give whooping cough to your newborn while caring for him. But remember, your baby will still be at risk for catching whooping cough from others. A recent study looked to see how effective Tdap was at preventing whooping cough in babies whose mothers got the vaccine while pregnant or in the hospital after giving birth. The study found that getting Tdap between 27 through 36 weeks of pregnancy is 85% more effective at preventing whooping cough in babies younger than 2 months old. Blood tests cannot tell if you need a whooping cough vaccine There are no blood tests that can tell you if you have enough antibodies in your body to protect yourself or your baby against whooping cough. Even if you have been sick with whooping cough in the past or previously received the vaccine, you still should get the vaccine during each pregnancy. Breastfeeding may pass some protective antibodies onto your baby By breastfeeding, you may pass some antibodies you have made in response to the vaccine to your baby. When you get a whooping cough vaccine during your pregnancy, you will have antibodies in your breast milk that you can share with your baby as soon as your milk comes in. However, your baby will not  get protective antibodies immediately if you wait to get the whooping cough vaccine until after delivering your baby. This is because it takes about 2 weeks for your body to create antibodies. Learn more about the health benefits of breastfeeding.  

## 2020-10-04 NOTE — Telephone Encounter (Signed)
Mar/fetal echo scheduled for: 11/13/2020@930a .

## 2020-10-04 NOTE — Progress Notes (Signed)
PRENATAL VISIT NOTE  Subjective:  Audrey Butler is a 25 y.o. G3P1011 at 523w6d being seen today for ongoing prenatal care.  She is currently monitored for the following issues for this high-risk pregnancy and has Supervision of high-risk pregnancy; Twin gestation in second trimester; and Prior pregnancy complicated by PIH, antepartum, second trimester on their problem list.  Patient reports no complaints.  Contractions: Not present. Vag. Bleeding: None.  Movement: Present. Denies leaking of fluid.   The following portions of the patient's history were reviewed and updated as appropriate: allergies, current medications, past family history, past medical history, past social history, past surgical history and problem list.   Objective:   Vitals:   10/04/20 0948  BP: 116/74  Pulse: 93  Weight: 227 lb (103 kg)    Fetal Status: Fetal Heart Rate (bpm): Left:134 Rt144   Movement: Present     General:  Alert, oriented and cooperative. Patient is in no acute distress.  Skin: Skin is warm and dry. No rash noted.   Cardiovascular: Normal heart rate noted  Respiratory: Normal respiratory effort, no problems with respiration noted  Abdomen: Soft, gravid, appropriate for gestational age.  Pain/Pressure: Present     Pelvic: Cervical exam deferred        Extremities: Normal range of motion.  Edema: None  Mental Status: Normal mood and affect. Normal behavior. Normal judgment and thought content.   Imaging: US MFM OB FOLLOW UP  Result Date: 09/26/2020 ----------------------------------------------------------------------  OBSTETRICS REPORT                       (Signed Final 09/26/2020 03:31 pm) ---------------------------------------------------------------------- Patient Info  ID #:       [id number]                          D.O.B.:  1995-08-23 (25 yrs)  Name:       Audrey Butler               Visit Date: 09/26/2020 01:24 pm  ---------------------------------------------------------------------- Performed By  Attending:        Lin Landsmanorenthian Booker      Ref. Address:     [address]                    [place]                                                             [address]                                                             [address]                                                             [place]  [address]  Performed By:     Reinaldo Raddle            Location[facility]                                     [facility]  Referred By:      [facility] ---------------------------------------------------------------------- Orders  #  Description                           Code        Ordered By  1  [place] MFM OB FOLLOW UP                   60454.09    Lin Landsman  2  [place] MFM OB FOLLOW UP ADDL              81191.47    Jettie Pagan ----------------------------------------------------------------------  #  Order #                     Accession #                Episode #  1  [id number]                   [id number]                 [id number]  2  [id number]                   [id number]                 [id number] ---------------------------------------------------------------------- Indications  Twin pregnancy, di/di, second trimester        O30.042  Obesity complicating pregnancy, second         O99.212  trimester (BMI 33)  Abnormal biochemical screen (NIPS) for  O28.9  Trisomy 59  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 Cord Insertion:      Visualized  Amniotic Fluid  AFI FV:      Within normal limits                              Largest Pocket(cm)                              5.19 ---------------------------------------------------------------------- Biometry (Fetus A)  BPD:      56.4  mm     G. Age:  23w 2d         66  %    CI:        82.25   %    70 - 86                                                          FL/HC:      18.8   %    19.2 - 20.8  HC:      196.2  mm     G. Age:  21w 6d          9  %    HC/AC:      1.13        1.05 - 1.21  AC:      173.9  mm     G. Age:  22w 2d         29  %    FL/BPD:     65.4   %    71 - 87  FL:       36.9  mm     G. Age:  21w 5d         13  %    FL/AC:      21.2   %    20 - 24  CER:      26.7  mm     G. Age:  24w 0d         98  %  LV:        6.9  mm  CM:        4.1  mm  Est. FW:     475  gm      1 lb 1 oz     17  %     FW Discordancy         4  % ---------------------------------------------------------------------- OB History  Gravidity:  3         Term:   1  TOP:          1        Living:  1 ---------------------------------------------------------------------- Gestational Age (Fetus A)  LMP:           22w 5d        Date:  04/20/20                 EDD:   01/25/21  U/S Today:     22w 2d                                        EDD:   01/28/21  Best:          22w 5d     Det. By:  LMP  (04/20/20)          EDD:   01/25/21 ---------------------------------------------------------------------- Anatomy (Fetus A)  Cranium:               Appears normal         Aortic Arch:            Previously seen  Cavum:                 Appears normal         Ductal Arch:            Not well visualized   Ventricles:            Appears normal         Diaphragm:              Appears normal  Choroid Plexus:        Previously seen        Stomach:                Appears normal, left                                                                        sided  Cerebellum:            Previously seen        Abdomen:                Appears normal  Posterior Fossa:       Previously seen        Abdominal Wall:         Previously seen  Nuchal Fold:           Previously seen        Cord Vessels:           Previously seen  Face:                  Appears normal         Kidneys:                Appear normal                         (orbits and profile)  Lips:  Appears normal         Bladder:                Appears normal  Thoracic:              Appears normal         Spine:                  Appears normal  Heart:                 Appears normal; EIF    Upper Extremities:      Previously seen  RVOT:                  Appears normal         Lower Extremities:      Previously seen  LVOT:                  Previously seen  Other:  Technically difficult due to fetal position. Fetus appears to be a female.          Heels and 5th digit prev visualized. ---------------------------------------------------------------------- Fetal Evaluation (Fetus B)  Num Of Fetuses:         2  Fetal Heart Rate(bpm):  143  Cardiac Activity:       Observed  Presentation:           Breech  Placenta:               Posterior  P. Cord Insertion:      Visualized  Amniotic Fluid  AFI FV:      Within normal limits                              Largest Pocket(cm)                              5.74 ---------------------------------------------------------------------- Biometry (Fetus B)  BPD:      54.6  mm     G. Age:  22w 4d         42  %    CI:        78.23   %    70 - 86                                                          FL/HC:      18.9   %    19.2 - 20.8  HC:      195.3  mm     G. Age:  21w 5d          8  %    HC/AC:      1.08        1.05 - 1.21   AC:      180.2  mm     G. Age:  22w 6d         47  %    FL/BPD:     67.6   %    71 - 87  FL:       36.9  mm     G. Age:  82w 5d  13  %    FL/AC:      20.5   %    20 - 24  Est. FW:     494  gm      1 lb 1 oz     25  %     FW Discordancy      0 \ 4 % ---------------------------------------------------------------------- Gestational Age (Fetus B)  LMP:           22w 5d        Date:  04/20/20                 EDD:   01/25/21  U/S Today:     22w 2d                                        EDD:   01/28/21  Best:          22w 5d     Det. By:  LMP  (04/20/20)          EDD:   01/25/21 ---------------------------------------------------------------------- Anatomy (Fetus B)  Cranium:               Appears normal         Aortic Arch:            Previously seen  Cavum:                 Appears normal         Ductal Arch:            Previously seen  Ventricles:            Appears normal         Diaphragm:              Appears normal  Choroid Plexus:        Appears normal         Stomach:                Appears normal, left                                                                        sided  Cerebellum:            Previously seen        Abdomen:                Appears normal  Posterior Fossa:       Previously seen        Abdominal Wall:         Appears nml (cord                                                                        insert, abd wall)  Nuchal Fold:           Previously seen  Cord Vessels:           Previously seen  Face:                  Orbits appear          Kidneys:                Appear normal                         normal  Lips:                  Appears normal         Bladder:                Appears normal  Thoracic:              Appears normal         Spine:                  Previously seen  Heart:                 Previously seen        Upper Extremities:      Appears normal  RVOT:                  Appears normal         Lower Extremities:      Previously seen  LVOT:                   Previously seen  Other:  Nasal bone prev visualized. Fetus appears to be a female. Technically          difficult due to fetal position. ---------------------------------------------------------------------- Cervix Uterus Adnexa  Cervix  Length:           4.18  cm.  Normal appearance by transabdominal scan. ---------------------------------------------------------------------- Impression  Diamniotic Dichorionic twins here for follow up growth with  abnormal T21 NIPS and echogenic bowel in Twin A.  The follow up anatomy appears normal with good amniotic  fluid and fetal movement was observed in Twin A and B.  There was no evidence of echogenic bowel or EIF seen today  Twin A EFW 17%  Twin B EFW 25%  4% growth discordance.  I reviewed the normal nature of today's ultrasound. Ms.  Bagg conveyed that she is taking low dose aspirin for  preeclampsia prevention.  She is meeting with our genetic counselor again today  I reiterated our prior counseling regarding the increased risk  for T21. We discussed that the only diagnostic test is an  amniocentesis. She and her significant other conveyed that  they do not desire to perform a diagnostic test today.  I also reported that her labs were normal for CMV and toxo  was normal.  We reviewed the sonographic findings and limitations of  ultrasound. The potential risks associated with a twin  gestation were discussed.  This discussion included a review  of the increased risk of miscarriages, anomalies, preterm  labor, and/or delivery, malpresentation, delivery via cesarean  section, gestational diabetes, and/or preeclampsia.  With  regards to fetal risks, there is an increased risk for fetal  growth restriction of one or both twins, preterm labor, and  associated morbidity, and intrauterine fetal demise.  We recommend growth scans every 4 weeks starting at 24  weeks with the initation of weekly antenatal testing in the  form of  twice weekly NST or weekly BPP should abnormal   fetal growth or intertwin discordance of greater than 20-25%  is noted.  Following counseling, all questions were addressed.  I spent 30 minutes with > 50% in face to face consultation. ---------------------------------------------------------------------- Recommendations  Follow up growth in 4 weeks. ----------------------------------------------------------------------               Lin Landsman, MD Electronically Signed Final Report   09/26/2020 03:31 pm ----------------------------------------------------------------------  [place] MFM OB FOLLOW UP ADDL GEST  Result Date: 09/26/2020 ----------------------------------------------------------------------  OBSTETRICS REPORT                       (Signed Final 09/26/2020 03:31 pm) ---------------------------------------------------------------------- Patient Info  ID #:       [id number]                          D.O.B.:  08-14-1995 (25 yrs)  Name:       Audrey Butler               Visit Date: 09/26/2020 01:24 pm ---------------------------------------------------------------------- Performed By  Attending:        Lin Landsman      Ref. Address:     [address]                    [place]                                                             [address]                                                             [address]                                                             [place]                                                             [address]  Performed By:     Reinaldo Raddle            Location[facility]                                     [facility]  Referred By:      [facility] ---------------------------------------------------------------------- Orders  #  Description                           Code        Ordered By  1  [place] MFM OB FOLLOW UP                    40102.72    Lin Landsman  2  [place] MFM OB FOLLOW UP ADDL              [id number]    Jettie Pagan ----------------------------------------------------------------------  #  Order #                     Accession #                Episode #  1  [id number]                   [id number]                 [id number]  2  [id number]                   [id number]                 [id number] ---------------------------------------------------------------------- Indications  Twin pregnancy, di/di, second trimester        O30.042  Obesity complicating pregnancy, second         O99.212  trimester (BMI 33)  Abnormal biochemical screen (NIPS) for         O28.9  Trisomy 21  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 Cord Insertion:      Visualized  Amniotic Fluid  AFI FV:      Within normal limits                              Largest Pocket(cm)                              5.19 ---------------------------------------------------------------------- Biometry (Fetus A)  BPD:      56.4  mm     G. Age:  23w 2d         66  %    CI:        82.25   %    70 - 86                                                          FL/HC:      18.8   %    19.2 - 20.8  HC:      196.2  mm     G. Age:  21w 6d          9  %    HC/AC:      1.13        1.05 - 1.21  AC:      173.9  mm     G. Age:  22w 2d         29  %    FL/BPD:     65.4   %    71 - 87  FL:       36.9  mm     G.  Age:  21w 5d         13  %    FL/AC:      21.2   %    20 - 24  CER:      26.7  mm     G. Age:  24w 0d         98  %  LV:        6.9  mm  CM:        4.1  mm  Est. FW:     475  gm      1 lb 1 oz     17  %     FW Discordancy         4  % ---------------------------------------------------------------------- OB History  Gravidity:    3         Term:   1  TOP:          1        Living:  1 ---------------------------------------------------------------------- Gestational Age (Fetus A)  LMP:           22w 5d        Date:  04/20/20                 EDD:   01/25/21  U/S Today:     22w 2d                                        EDD:   01/28/21  Best:  22w 5d     Det. By:  LMP  (04/20/20)          EDD:   01/25/21 ---------------------------------------------------------------------- Anatomy (Fetus A)  Cranium:               Appears normal         Aortic Arch:            Previously seen  Cavum:                 Appears normal         Ductal Arch:            Not well visualized  Ventricles:            Appears normal         Diaphragm:              Appears normal  Choroid Plexus:        Previously seen        Stomach:                Appears normal, left                                                                        sided  Cerebellum:            Previously seen        Abdomen:                Appears normal  Posterior Fossa:       Previously seen        Abdominal Wall:         Previously seen  Nuchal Fold:           Previously seen        Cord Vessels:           Previously seen  Face:                  Appears normal         Kidneys:                Appear normal                         (orbits and profile)  Lips:                  Appears normal         Bladder:                Appears normal  Thoracic:              Appears normal         Spine:                  Appears normal  Heart:                 Appears normal; EIF    Upper Extremities:      Previously seen  RVOT:                  Appears normal         Lower  Extremities:  Previously seen  LVOT:                  Previously seen  Other:  Technically difficult due to fetal position. Fetus appears to be a female.          Heels and 5th digit prev visualized. ---------------------------------------------------------------------- Fetal Evaluation (Fetus B)  Num Of Fetuses:         2  Fetal Heart Rate(bpm):  143  Cardiac Activity:       Observed  Presentation:           Breech  Placenta:               Posterior  P. Cord Insertion:      Visualized  Amniotic Fluid  AFI FV:      Within normal limits                              Largest Pocket(cm)                              5.74 ---------------------------------------------------------------------- Biometry (Fetus B)  BPD:      54.6  mm     G. Age:  22w 4d         42  %    CI:        78.23   %    70 - 86                                                          FL/HC:      18.9   %    19.2 - 20.8  HC:      195.3  mm     G. Age:  21w 5d          8  %    HC/AC:      1.08        1.05 - 1.21  AC:      180.2  mm     G. Age:  22w 6d         47  %    FL/BPD:     67.6   %    71 - 87  FL:       36.9  mm     G. Age:  21w 5d         13  %    FL/AC:      20.5   %    20 - 24  Est. FW:     494  gm      1 lb 1 oz     25  %     FW Discordancy      0 \ 4 % ---------------------------------------------------------------------- Gestational Age (Fetus B)  LMP:           22w 5d        Date:  04/20/20                 EDD:   01/25/21  U/S Today:     22w 2d  EDD:   01/28/21  Best:          22w 5d     Det. By:  LMP  (04/20/20)          EDD:   01/25/21 ---------------------------------------------------------------------- Anatomy (Fetus B)  Cranium:               Appears normal         Aortic Arch:            Previously seen  Cavum:                 Appears normal         Ductal Arch:            Previously seen  Ventricles:            Appears normal         Diaphragm:              Appears normal  Choroid Plexus:         Appears normal         Stomach:                Appears normal, left                                                                        sided  Cerebellum:            Previously seen        Abdomen:                Appears normal  Posterior Fossa:       Previously seen        Abdominal Wall:         Appears nml (cord                                                                        insert, abd wall)  Nuchal Fold:           Previously seen        Cord Vessels:           Previously seen  Face:                  Orbits appear          Kidneys:                Appear normal                         normal  Lips:                  Appears normal         Bladder:                Appears normal  Thoracic:              Appears normal  Spine:                  Previously seen  Heart:                 Previously seen        Upper Extremities:      Appears normal  RVOT:                  Appears normal         Lower Extremities:      Previously seen  LVOT:                  Previously seen  Other:  Nasal bone prev visualized. Fetus appears to be a female. Technically          difficult due to fetal position. ---------------------------------------------------------------------- Cervix Uterus Adnexa  Cervix  Length:           4.18  cm.  Normal appearance by transabdominal scan. ---------------------------------------------------------------------- Impression  Diamniotic Dichorionic twins here for follow up growth with  abnormal T21 NIPS and echogenic bowel in Twin A.  The follow up anatomy appears normal with good amniotic  fluid and fetal movement was observed in Twin A and B.  There was no evidence of echogenic bowel or EIF seen today  Twin A EFW 17%  Twin B EFW 25%  4% growth discordance.  I reviewed the normal nature of today's ultrasound. Ms.  Harden conveyed that she is taking low dose aspirin for  preeclampsia prevention.  She is meeting with our genetic counselor again today  I reiterated our prior counseling  regarding the increased risk  for T21. We discussed that the only diagnostic test is an  amniocentesis. She and her significant other conveyed that  they do not desire to perform a diagnostic test today.  I also reported that her labs were normal for CMV and toxo  was normal.  We reviewed the sonographic findings and limitations of  ultrasound. The potential risks associated with a twin  gestation were discussed.  This discussion included a review  of the increased risk of miscarriages, anomalies, preterm  labor, and/or delivery, malpresentation, delivery via cesarean  section, gestational diabetes, and/or preeclampsia.  With  regards to fetal risks, there is an increased risk for fetal  growth restriction of one or both twins, preterm labor, and  associated morbidity, and intrauterine fetal demise.  We recommend growth scans every 4 weeks starting at 24  weeks with the initation of weekly antenatal testing in the  form of twice weekly NST or weekly BPP should abnormal  fetal growth or intertwin discordance of greater than 20-25%  is noted.  Following counseling, all questions were addressed.  I spent 30 minutes with > 50% in face to face consultation. ---------------------------------------------------------------------- Recommendations  Follow up growth in 4 weeks. ----------------------------------------------------------------------               Lin Landsman, MD Electronically Signed Final Report   09/26/2020 03:31 pm ----------------------------------------------------------------------   Assessment and Plan:  Pregnancy: G3P1011 at [redacted]w[redacted]d 1. Dichorionic diamniotic twin pregnancy in second trimester Doing well, monitored by MFM  2. Prior pregnancy complicated by PIH, antepartum, second trimester Stable BP, Encouraged to take ASA.  3. [redacted] weeks gestation of pregnancy 4. Supervision of high risk pregnancy in second trimester No other issues. Preterm labor symptoms and general obstetric precautions  including but not limited to vaginal bleeding, contractions, leaking of fluid and fetal movement were  reviewed in detail with the patient. Please refer to After Visit Summary for other counseling recommendations.   Return in about 4 weeks (around 11/01/2020) for 2 hr GTT, 3rd trimester labs, TDap, OFFICE OB VISIT (MD only).  Future Appointments  Date Time Provider Department Center  10/24/2020  1:00 PM [facility] NURSE [facility]  10/24/2020  1:15 PM [facility] US2 [facility]  11/01/2020  9:15 AM [facility] LAB [facility] None  11/01/2020  9:30 AM [place] Fillers, MD [facility] None    Jaynie Collins, MD

## 2020-10-08 ENCOUNTER — Ambulatory Visit: Payer: Self-pay

## 2020-10-24 ENCOUNTER — Encounter: Payer: Self-pay | Admitting: *Deleted

## 2020-10-24 ENCOUNTER — Other Ambulatory Visit: Payer: Self-pay

## 2020-10-24 ENCOUNTER — Ambulatory Visit: Payer: BC Managed Care – PPO | Attending: Maternal & Fetal Medicine

## 2020-10-24 ENCOUNTER — Ambulatory Visit: Payer: BC Managed Care – PPO | Admitting: *Deleted

## 2020-10-24 VITALS — BP 129/67 | HR 89

## 2020-10-24 DIAGNOSIS — Z3A26 26 weeks gestation of pregnancy: Secondary | ICD-10-CM

## 2020-10-24 DIAGNOSIS — O30042 Twin pregnancy, dichorionic/diamniotic, second trimester: Secondary | ICD-10-CM

## 2020-10-24 DIAGNOSIS — Z8759 Personal history of other complications of pregnancy, childbirth and the puerperium: Secondary | ICD-10-CM

## 2020-10-24 DIAGNOSIS — O281 Abnormal biochemical finding on antenatal screening of mother: Secondary | ICD-10-CM

## 2020-10-24 DIAGNOSIS — Z363 Encounter for antenatal screening for malformations: Secondary | ICD-10-CM | POA: Diagnosis not present

## 2020-10-24 DIAGNOSIS — O99212 Obesity complicating pregnancy, second trimester: Secondary | ICD-10-CM

## 2020-10-24 DIAGNOSIS — O322XX1 Maternal care for transverse and oblique lie, fetus 1: Secondary | ICD-10-CM

## 2020-10-24 DIAGNOSIS — Z148 Genetic carrier of other disease: Secondary | ICD-10-CM

## 2020-10-24 DIAGNOSIS — O322XX2 Maternal care for transverse and oblique lie, fetus 2: Secondary | ICD-10-CM

## 2020-10-24 DIAGNOSIS — E669 Obesity, unspecified: Secondary | ICD-10-CM

## 2020-10-25 ENCOUNTER — Other Ambulatory Visit: Payer: Self-pay | Admitting: *Deleted

## 2020-10-25 DIAGNOSIS — O30042 Twin pregnancy, dichorionic/diamniotic, second trimester: Secondary | ICD-10-CM

## 2020-10-30 ENCOUNTER — Emergency Department (HOSPITAL_COMMUNITY)
Admission: EM | Admit: 2020-10-30 | Discharge: 2020-10-30 | Disposition: A | Discharge status: Home / Self Care | Payer: BC Managed Care – PPO | Attending: Emergency Medicine | Admitting: Emergency Medicine

## 2020-10-30 ENCOUNTER — Encounter (HOSPITAL_COMMUNITY): Payer: Self-pay

## 2020-10-30 ENCOUNTER — Other Ambulatory Visit: Payer: Self-pay

## 2020-10-30 DIAGNOSIS — E669 Obesity, unspecified: Secondary | ICD-10-CM | POA: Insufficient documentation

## 2020-10-30 DIAGNOSIS — O30003 Twin pregnancy, unspecified number of placenta and unspecified number of amniotic sacs, third trimester: Secondary | ICD-10-CM

## 2020-10-30 DIAGNOSIS — O99212 Obesity complicating pregnancy, second trimester: Secondary | ICD-10-CM | POA: Insufficient documentation

## 2020-10-30 DIAGNOSIS — O30012 Twin pregnancy, monochorionic/monoamniotic, second trimester: Secondary | ICD-10-CM | POA: Diagnosis not present

## 2020-10-30 DIAGNOSIS — Z3A27 27 weeks gestation of pregnancy: Secondary | ICD-10-CM | POA: Insufficient documentation
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
## 2020-10-30 NOTE — ED Notes (Signed)
An After Visit Summary was printed and given to the patient. Discharge instructions given and no further questions at this time.  

## 2020-10-30 NOTE — ED Notes (Signed)
Fetal heart tones obtained.  Baby A 141 Baby B 153

## 2020-10-30 NOTE — Discharge Instructions (Signed)
I am reassured you have no pain or tenderness anywhere.  Fetal heart tones look good. Follow up closely with you OB/GYN
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
## 2020-11-01 ENCOUNTER — Other Ambulatory Visit: Payer: BC Managed Care – PPO

## 2020-11-01 ENCOUNTER — Ambulatory Visit (INDEPENDENT_AMBULATORY_CARE_PROVIDER_SITE_OTHER): Payer: BC Managed Care – PPO | Admitting: Obstetrics and Gynecology

## 2020-11-01 ENCOUNTER — Other Ambulatory Visit: Payer: Self-pay

## 2020-11-01 VITALS — BP 117/76 | HR 79 | Wt 230.0 lb

## 2020-11-01 DIAGNOSIS — O30042 Twin pregnancy, dichorionic/diamniotic, second trimester: Secondary | ICD-10-CM

## 2020-11-01 DIAGNOSIS — O0992 Supervision of high risk pregnancy, unspecified, second trimester: Secondary | ICD-10-CM

## 2020-11-01 DIAGNOSIS — Z3A27 27 weeks gestation of pregnancy: Secondary | ICD-10-CM

## 2020-11-01 DIAGNOSIS — O09892 Supervision of other high risk pregnancies, second trimester: Secondary | ICD-10-CM

## 2020-11-01 NOTE — Progress Notes (Signed)
Reports no complaints today.  Declined TDAP vaccine.

## 2020-11-01 NOTE — Progress Notes (Signed)
   PRENATAL VISIT NOTE  Subjective:  Audrey Butler is a 25 y.o. G3P1011 at [redacted]w[redacted]d being seen today for ongoing prenatal care.  She is currently monitored for the following issues for this high-risk pregnancy and has Supervision of high-risk pregnancy; Twin gestation in second trimester; Prior pregnancy complicated by PIH, antepartum, second trimester; and [redacted] weeks gestation of pregnancy on their problem list.  Patient doing well with no acute concerns today. She reports no complaints.  Contractions: Not present. Vag. Bleeding: None.  Movement: Present. Denies leaking of fluid.   The following portions of the patient's history were reviewed and updated as appropriate: allergies, current medications, past family history, past medical history, past social history, past surgical history and problem list. Problem list updated.  Objective:   Vitals:   11/01/20 0910  BP: 117/76  Pulse: 79  Weight: 230 lb (104.3 kg)    Fetal Status: Fetal Heart Rate (bpm): A:133/B:132 Fundal Height: 32 cm Movement: Present     General:  Alert, oriented and cooperative. Patient is in no acute distress.  Skin: Skin is warm and dry. No rash noted.   Cardiovascular: Normal heart rate noted  Respiratory: Normal respiratory effort, no problems with respiration noted  Abdomen: Soft, gravid, appropriate for gestational age.  Pain/Pressure: Present     Pelvic: Cervical exam deferred        Extremities: Normal range of motion.  Edema: None  Mental Status:  Normal mood and affect. Normal behavior. Normal judgment and thought content.   Assessment and Plan:  Pregnancy: G3P1011 at [redacted]w[redacted]d  1. Supervision of high risk pregnancy in second trimester Routine care today - Glucose Tolerance, 2 Hours w/1 Hour - RPR - CBC - HIV antibody (with reflex)  2. [redacted] weeks gestation of pregnancy   3. Dichorionic diamniotic twin pregnancy in second trimester 11% discordance, Fetal echo on 6/1, fetal growth 6/13  4. Prior  pregnancy complicated by PIH, antepartum, second trimester No s/sx PIH  Preterm labor symptoms and general obstetric precautions including but not limited to vaginal bleeding, contractions, leaking of fluid and fetal movement were reviewed in detail with the patient.  Please refer to After Visit Summary for other counseling recommendations.   Return in about 2 weeks (around 11/15/2020) for [facility], in person.   Mariel Aloe, MD Faculty Attending [facility]

## 2020-11-02 LAB — HIV ANTIBODY (ROUTINE TESTING W REFLEX): HIV Screen 4th Generation wRfx: NONREACTIVE

## 2020-11-02 LAB — CBC
Hematocrit: 29.3 % — ABNORMAL LOW (ref 34.0–46.6)
Hemoglobin: 9.5 g/dL — ABNORMAL LOW (ref 11.1–15.9)
MCH: 26.7 pg (ref 26.6–33.0)
MCHC: 32.4 g/dL (ref 31.5–35.7)
MCV: 82 fL (ref 79–97)
Platelets: 208 x10E3/uL (ref 150–450)
RBC: 3.56 x10E6/uL — ABNORMAL LOW (ref 3.77–5.28)
RDW: 13 % (ref 11.7–15.4)
WBC: 4.4 x10E3/uL (ref 3.4–10.8)

## 2020-11-02 LAB — GLUCOSE TOLERANCE, 2 HOURS W/ 1HR
Glucose, 1 hour: 96 mg/dL (ref 65–179)
Glucose, 2 hour: 68 mg/dL (ref 65–152)
Glucose, Fasting: 81 mg/dL (ref 65–91)

## 2020-11-02 LAB — RPR: RPR Ser Ql: NONREACTIVE

## 2020-11-14 ENCOUNTER — Encounter (HOSPITAL_COMMUNITY): Payer: Self-pay | Admitting: Obstetrics and Gynecology

## 2020-11-14 ENCOUNTER — Inpatient Hospital Stay (HOSPITAL_COMMUNITY)
Admission: AD | Admit: 2020-11-14 | Discharge: 2020-11-14 | Disposition: A | Discharge status: Home / Self Care | Payer: BC Managed Care – PPO | Attending: Obstetrics and Gynecology | Admitting: Obstetrics and Gynecology

## 2020-11-14 ENCOUNTER — Other Ambulatory Visit: Payer: Self-pay

## 2020-11-14 DIAGNOSIS — O30003 Twin pregnancy, unspecified number of placenta and unspecified number of amniotic sacs, third trimester: Secondary | ICD-10-CM | POA: Diagnosis not present

## 2020-11-14 DIAGNOSIS — R519 Headache, unspecified: Secondary | ICD-10-CM

## 2020-11-14 DIAGNOSIS — O99013 Anemia complicating pregnancy, third trimester: Secondary | ICD-10-CM

## 2020-11-14 DIAGNOSIS — O26893 Other specified pregnancy related conditions, third trimester: Secondary | ICD-10-CM | POA: Diagnosis not present

## 2020-11-14 DIAGNOSIS — Z7982 Long term (current) use of aspirin: Secondary | ICD-10-CM | POA: Diagnosis not present

## 2020-11-14 DIAGNOSIS — Z3A29 29 weeks gestation of pregnancy: Secondary | ICD-10-CM | POA: Diagnosis not present

## 2020-11-14 DIAGNOSIS — R030 Elevated blood-pressure reading, without diagnosis of hypertension: Secondary | ICD-10-CM | POA: Diagnosis present

## 2020-11-14 LAB — COMPREHENSIVE METABOLIC PANEL
ALT: 11 U/L (ref 0–44)
AST: 13 U/L — ABNORMAL LOW (ref 15–41)
Albumin: 2.5 g/dL — ABNORMAL LOW (ref 3.5–5.0)
Alkaline Phosphatase: 89 U/L (ref 38–126)
Anion gap: 6 (ref 5–15)
BUN: <5 mg/dL — ABNORMAL LOW (ref 6–20)
CO2: 23 mmol/L (ref 22–32)
Calcium: 8.2 mg/dL — ABNORMAL LOW (ref 8.9–10.3)
Chloride: 107 mmol/L (ref 98–111)
Creatinine, Ser: 0.55 mg/dL (ref 0.44–1.00)
GFR, Estimated: >60 mL/min (ref >60)
Glucose, Bld: 95 mg/dL (ref 70–99)
Potassium: 3.2 mmol/L — ABNORMAL LOW (ref 3.5–5.1)
Sodium: 136 mmol/L (ref 135–145)
Total Bilirubin: 0.5 mg/dL (ref 0.3–1.2)
Total Protein: 5.2 g/dL — ABNORMAL LOW (ref 6.5–8.1)

## 2020-11-14 LAB — CBC
HCT: 27.4 % — ABNORMAL LOW (ref 36.0–46.0)
Hemoglobin: 8.6 g/dL — ABNORMAL LOW (ref 12.0–15.0)
MCH: 25.9 pg — ABNORMAL LOW (ref 26.0–34.0)
MCHC: 31.4 g/dL (ref 30.0–36.0)
MCV: 82.5 fL (ref 80.0–100.0)
Platelets: 223 10*3/uL (ref 150–400)
RBC: 3.32 MIL/uL — ABNORMAL LOW (ref 3.87–5.11)
RDW: 13 % (ref 11.5–15.5)
WBC: 5.2 10*3/uL (ref 4.0–10.5)
nRBC: 0 % (ref 0.0–0.2)

## 2020-11-14 LAB — URINALYSIS, ROUTINE W REFLEX MICROSCOPIC
Bilirubin Urine: NEGATIVE
Glucose, UA: NEGATIVE mg/dL
Hgb urine dipstick: NEGATIVE
Ketones, ur: NEGATIVE mg/dL
Leukocytes,Ua: NEGATIVE
Nitrite: NEGATIVE
Protein, ur: NEGATIVE mg/dL
Specific Gravity, Urine: 1.011 (ref 1.005–1.030)
pH: 7 (ref 5.0–8.0)

## 2020-11-14 LAB — PROTEIN / CREATININE RATIO, URINE
Creatinine, Urine: 91.05 mg/dL
Protein Creatinine Ratio: 0.11 mg/mg{Cre} (ref 0.00–0.15)
Total Protein, Urine: 10 mg/dL

## 2020-11-14 MED ORDER — ACETAMINOPHEN 500 MG PO TABS
1000.0000 mg | ORAL_TABLET | Freq: Once | ORAL | Status: AC
  Administered 2020-11-14: 1000 mg via ORAL
  Filled 2020-11-14: qty 2

## 2020-11-14 MED ORDER — LACTATED RINGERS IV BOLUS
1000.0000 mL | Freq: Once | INTRAVENOUS | Status: AC
  Administered 2020-11-14: 1000 mL via INTRAVENOUS

## 2020-11-14 MED ORDER — SODIUM CHLORIDE 0.9 % IV SOLN
300.0000 mg | Freq: Once | INTRAVENOUS | Status: AC
  Administered 2020-11-14: 300 mg via INTRAVENOUS
  Filled 2020-11-14: qty 15

## 2020-11-14 MED ORDER — METOCLOPRAMIDE HCL 5 MG/ML IJ SOLN
10.0000 mg | Freq: Once | INTRAMUSCULAR | Status: AC
  Administered 2020-11-14: 10 mg via INTRAVENOUS
  Filled 2020-11-14: qty 2

## 2020-11-14 MED ORDER — CYCLOBENZAPRINE HCL 5 MG PO TABS
7.5000 mg | ORAL_TABLET | Freq: Once | ORAL | Status: AC
  Administered 2020-11-14: 7.5 mg via ORAL
  Filled 2020-11-14: qty 2

## 2020-11-14 MED ORDER — SODIUM CHLORIDE 0.9 % IV SOLN: INTRAVENOUS | Status: DC

## 2020-11-14 NOTE — Discharge Instructions (Signed)
How to Take Your Blood Pressure Blood pressure is a measurement of how strongly your blood is pressing against the walls of your arteries. Arteries are blood vessels that carry blood from your heart throughout your body. Your health care provider takes your blood pressure at each office visit. You can also take your own blood pressure at home with a blood pressure monitor. You may need to take your own blood pressure to:  Confirm a diagnosis of high blood pressure (hypertension).  Monitor your blood pressure over time.  Make sure your blood pressure medicine is working. Supplies needed:  Blood pressure monitor.  Dining room chair to sit in.  Table or desk.  Small notebook and pencil or pen. How to prepare To get the most accurate reading, avoid the following for 30 minutes before you check your blood pressure:  Drinking caffeine.  Drinking alcohol.  Eating.  Smoking.  Exercising. Five minutes before you check your blood pressure:  Use the bathroom and urinate so that you have an empty bladder.  Sit quietly in a dining room chair. Do not sit in a soft couch or an armchair. Do not talk. How to take your blood pressure To check your blood pressure, follow the instructions in the manual that came with your blood pressure monitor. If you have a digital blood pressure monitor, the instructions may be as follows: 1. Sit up straight in a chair. 2. Place your feet on the floor. Do not cross your ankles or legs. 3. Rest your left arm at the level of your heart on a table or desk or on the arm of a chair. 4. Pull up your shirt sleeve. 5. Wrap the blood pressure cuff around the upper part of your left arm, 1 inch (2.5 cm) above your elbow. It is best to wrap the cuff around bare skin. 6. Fit the cuff snugly around your arm. You should be able to place only one finger between the cuff and your arm. 7. Position the cord so that it rests in the bend of your elbow. 8. Press the power  button. 9. Sit quietly while the cuff inflates and deflates. 10. Read the digital reading on the monitor screen and write the numbers down (record them) in a notebook. 11. Wait 2-3 minutes, then repeat the steps, starting at step 1.   What does my blood pressure reading mean? A blood pressure reading consists of a higher number over a lower number. Ideally, your blood pressure should be below 120/80. The first ("top") number is called the systolic pressure. It is a measure of the pressure in your arteries as your heart beats. The second ("bottom") number is called the diastolic pressure. It is a measure of the pressure in your arteries as the heart relaxes. Blood pressure is classified into five stages. The following are the stages for adults who do not have a short-term serious illness or a chronic condition. Systolic pressure and diastolic pressure are measured in a unit called mm Hg (millimeters of mercury).  Normal  Systolic pressure: below 120.  Diastolic pressure: below 80. Elevated  Systolic pressure: 120-129.  Diastolic pressure: below 80. Hypertension stage 1  Systolic pressure: 130-139.  Diastolic pressure: 80-89. Hypertension stage 2  Systolic pressure: 140 or above.  Diastolic pressure: 90 or above. You can have elevated blood pressure or hypertension even if only the systolic or only the diastolic number in your reading is higher than normal. Follow these instructions at home:  Check your blood   pressure as often as recommended by your health care provider.  Check your blood pressure at the same time every day.  Take your monitor to the next appointment with your health care provider to make sure that: ? You are using it correctly. ? It provides accurate readings.  Be sure you understand what your goal blood pressure numbers are.  Tell your health care provider if you are having any side effects from blood pressure medicine.  Keep all follow-up visits as told by  your health care provider. This is important. General tips  Your health care provider can suggest a reliable monitor that will meet your needs. There are several types of home blood pressure monitors.  Choose a monitor that has an arm cuff. Do not choose a monitor that measures your blood pressure from your wrist or finger.  Choose a cuff that wraps snugly around your upper arm. You should be able to fit only one finger between your arm and the cuff.  You can buy a blood pressure monitor at most drugstores or online. Where to find more information American Heart Association: www.heart.org Contact a health care provider if:  Your blood pressure is consistently high. Get help right away if:  Your systolic blood pressure is higher than 180.  Your diastolic blood pressure is higher than 120. Summary  Blood pressure is a measurement of how strongly your blood is pressing against the walls of your arteries.  A blood pressure reading consists of a higher number over a lower number. Ideally, your blood pressure should be below 120/80.  Check your blood pressure at the same time every day.  Avoid caffeine, alcohol, smoking, and exercise for 30 minutes prior to checking your blood pressure. These agents can affect the accuracy of the blood pressure reading. This information is not intended to replace advice given to you by your health care provider. Make sure you discuss any questions you have with your health care provider. Document Revised: 05/26/2019 Document Reviewed: 05/26/2019 Elsevier Patient Education  2021 Elsevier Inc.  

## 2020-11-14 NOTE — MAU Note (Signed)
Headache, relieved by tylenol but "comes right back" pt states. Further states she has pain on outer aspect of right hip and thigh. Larence Penning, RN

## 2020-11-14 NOTE — MAU Provider Note (Addendum)
Chief Complaint:  Headache   Event Date/Time   First Provider Initiated Contact with Patient 11/14/20 0700     HPI: Audrey Butler is a 25 y.o. G3P1011 at 51w5dwho presents to maternity admissions reporting headache, recurrent.  States it gets better with Tylenol but then recurs.  No visual changes.  BP normal at [facility] . She reports good fetal movement, denies LOF, vaginal bleeding, vaginal itching/burning, urinary symptoms, dizziness, n/v, diarrhea, constipation or fever/chills.  She denies visual changes or RUQ abdominal pain.  Headache  This is a recurrent problem. Pertinent negatives include no abdominal pain, blurred vision, coughing, dizziness, fever, nausea, sore throat or vomiting. Nothing aggravates the symptoms. She has tried acetaminophen for the symptoms. The treatment provided moderate relief.    RN Note: Headache, relieved by tylenol but "comes right back" pt states. Further states she has pain on outer aspect of right hip and thigh. Charisse Klinefelter, RN  Past Medical History: Past Medical History:  Diagnosis Date  . Pregnancy induced hypertension     Past obstetric history: OB History  Gravida Para Term Preterm AB Living  $Remov'3 1 1   1 1  'DWGUFc$ SAB IAB Ectopic Multiple Live Births        0 1    # Outcome Date GA Lbr Len/2nd Weight Sex Delivery Anes PTL Lv  3 Current           2 Term 06/13/17 [redacted]w[redacted]d 08:30 / 00:44 3005 g M Vag-Spont EPI  LIV     Birth Comments: WDL  1 AB             Past Surgical History: Past Surgical History:  Procedure Laterality Date  . NO PAST SURGERIES      Family History: Family History  Problem Relation Age of Onset  . Obesity Neg Hx   . Hypertension Neg Hx   . Heart disease Neg Hx   . Diabetes Neg Hx   . Cancer Neg Hx     Social History: Social History   Tobacco Use  . Smoking status: Never Smoker  . Smokeless tobacco: Never Used  Vaping Use  . Vaping Use: Never used  Substance Use Topics  . Alcohol use: No  . Drug use: No     Allergies: No Known Allergies  Meds:  Medications Prior to Admission  Medication Sig Dispense Refill Last Dose  . aspirin EC 81 MG tablet Take 1 tablet (81 mg total) by mouth daily. Swallow whole. (Patient not taking: No sig reported) 30 tablet 11   . Blood Pressure Monitoring (BLOOD PRESSURE KIT) DEVI 1 kit by Does not apply route once a week. Check Blood Pressure regularly and record readings into the Babyscripts App.  Large Cuff.  DX O90.0 1 each 0   . metroNIDAZOLE (FLAGYL) 500 MG tablet Take 1 tablet (500 mg total) by mouth 2 (two) times daily. (Patient not taking: Reported on 10/04/2020) 14 tablet 0   . Prenatal Vit-Fe Fumarate-FA (PREPLUS) 27-1 MG TABS Take 1 tablet by mouth daily. 30 tablet 13   . Prenatal-DSS-FeCb-FeGl-FA (CITRANATAL BLOOM) 90-1 MG TABS Take 1 tablet by mouth daily. (Patient not taking: No sig reported) 30 tablet 12   . terconazole (TERAZOL 7) 0.4 % vaginal cream Place 1 applicator vaginally at bedtime for 7 days. (Patient not taking: No sig reported) 45 g 0     I have reviewed patient's Past Medical Hx, Surgical Hx, Family Hx, Social Hx, medications and allergies.   ROS:  Review of Systems  Constitutional: Negative for fever.  HENT: Negative for sore throat.   Eyes: Negative for blurred vision.  Respiratory: Negative for cough.   Gastrointestinal: Negative for abdominal pain, nausea and vomiting.  Neurological: Positive for headaches. Negative for dizziness.   Other systems negative  Physical Exam   Patient Vitals for the past 24 hrs:  BP Temp Temp src Pulse Resp  11/14/20 0628 (!) 141/76 98.1 F (36.7 C) Oral 80 18   Vitals:   11/14/20 0628 11/14/20 0715 11/14/20 0731  BP: (!) 141/76 116/71 119/69  Pulse: 80 79 77  Resp: 18    Temp: 98.1 F (36.7 C)    TempSrc: Oral    SpO2:  99%    Constitutional: Well-developed, well-nourished female in no acute distress.  Cardiovascular: normal rate and rhythm Respiratory: normal effort GI: Abd soft,  non-tender, gravid appropriate for gestational age.   No rebound or guarding. MS: Extremities nontender, no edema, normal ROM Neurologic: Alert and oriented x 4.  GU: Neg CVAT.   FHT:  Baseline 140s (both) , moderate variability, accelerations present, no decelerations except 1 possible variable decel with twin A Contractions:  Rare   Labs: Preeclampsia labs ordered O/Positive/-- (01/21 1041)  Imaging:    MAU Course/MDM: I have ordered labs  NST reviewed    Assessment: No diagnosis found.  Plan: Report given to oncoming provider  Hansel Feinstein CNM, MSN Certified Nurse-Midwife 11/14/2020    On re-evaluation patient reported headache, treated with fluids, reglan, tylenol, and flexeril with resolution of headache. CBC noted to have hgb 8.6, will give IV Venofer while patient is here. FHT reviewed, no definitive decel, both babies reactive with 10x10 accels and modeate variability, some wandering baseline at times but overall very reassuring and appropriate for [redacted] weeks gestational age. Patient did have single elevated BP, will need to be watched closely. Has an appt in four days as an outpatient.   Discharge to home in stable condition once IV iron is done infusing.   11:36 AM Clarnce Flock, MD/MPH Attending Family Medicine Physician, [facility] for [facility]  7:00 AM

## 2020-11-18 ENCOUNTER — Other Ambulatory Visit: Payer: Self-pay

## 2020-11-18 ENCOUNTER — Ambulatory Visit (INDEPENDENT_AMBULATORY_CARE_PROVIDER_SITE_OTHER): Payer: BC Managed Care – PPO | Admitting: Obstetrics and Gynecology

## 2020-11-18 ENCOUNTER — Encounter: Payer: Self-pay | Admitting: Obstetrics and Gynecology

## 2020-11-18 VITALS — BP 108/69 | HR 80 | Wt 236.0 lb

## 2020-11-18 DIAGNOSIS — O99013 Anemia complicating pregnancy, third trimester: Secondary | ICD-10-CM

## 2020-11-18 DIAGNOSIS — O289 Unspecified abnormal findings on antenatal screening of mother: Secondary | ICD-10-CM | POA: Insufficient documentation

## 2020-11-18 DIAGNOSIS — O30042 Twin pregnancy, dichorionic/diamniotic, second trimester: Secondary | ICD-10-CM

## 2020-11-18 DIAGNOSIS — Z3A30 30 weeks gestation of pregnancy: Secondary | ICD-10-CM

## 2020-11-18 DIAGNOSIS — O0993 Supervision of high risk pregnancy, unspecified, third trimester: Secondary | ICD-10-CM

## 2020-11-18 DIAGNOSIS — O09892 Supervision of other high risk pregnancies, second trimester: Secondary | ICD-10-CM

## 2020-11-18 HISTORY — DX: Unspecified abnormal findings on antenatal screening of mother: O28.9

## 2020-11-18 NOTE — Progress Notes (Signed)
   PRENATAL VISIT NOTE  Subjective:  Audrey Butler is a 25 y.o. G3P1011 at [redacted]w[redacted]d being seen today for ongoing prenatal care.  She is currently monitored for the following issues for this high-risk pregnancy and has Anemia during pregnancy in third trimester; Supervision of high-risk pregnancy; Twin gestation in second trimester; Prior pregnancy complicated by PIH, antepartum, second trimester; [redacted] weeks gestation of pregnancy; Elevated blood pressure reading without diagnosis of hypertension; and Abnormal antenatal test on their problem list.  Patient reports headache.  Contractions: Not present. Vag. Bleeding: None.  Movement: Present. Denies leaking of fluid.   The following portions of the patient's history were reviewed and updated as appropriate: allergies, current medications, past family history, past medical history, past social history, past surgical history and problem list.   Objective:   Vitals:   11/18/20 1116  BP: 108/69  Pulse: 80  Weight: 236 lb (107 kg)    Fetal Status: Fetal Heart Rate (bpm): A130/B140   Movement: Present     General:  Alert, oriented and cooperative. Patient is in no acute distress.  Skin: Skin is warm and dry. No rash noted.   Cardiovascular: Normal heart rate noted  Respiratory: Normal respiratory effort, no problems with respiration noted  Abdomen: Soft, gravid, appropriate for gestational age.  Pain/Pressure: Absent     Pelvic: Cervical exam deferred        Extremities: Normal range of motion.     Mental Status: Normal mood and affect. Normal behavior. Normal judgment and thought content.   Assessment and Plan:  Pregnancy: G3P1011 at [redacted]w[redacted]d  1. Supervision of high risk pregnancy in third trimester Reviewed headache precautions, safety of tylenol, can take 2 extra strength at one time  2. Dichorionic diamniotic twin pregnancy in second trimester Reviewed mode of delivery, she would like to try to SVD if possible but fine with CS if that is  needed for safe delivery  3. Prior pregnancy complicated by PIH, antepartum, second trimester  4. Anemia during pregnancy in third trimester Received Venofer in MAU 11/14/20  5. [redacted] weeks gestation of pregnancy  6. Abnormal antenatal test High risk T21 on panorama, declines amnio  Preterm labor symptoms and general obstetric precautions including but not limited to vaginal bleeding, contractions, leaking of fluid and fetal movement were reviewed in detail with the patient. Please refer to After Visit Summary for other counseling recommendations.   Return in about 2 weeks (around 12/02/2020) for high OB, in person.  Future Appointments  Date Time Provider Department Center  11/25/2020  2:45 PM [facility] NURSE [facility]  11/25/2020  3:00 PM [facility] US1 [facility]  12/02/2020  9:45 AM Adam Phenix, MD [facility] None    Conan Bowens, MD

## 2020-11-18 NOTE — Progress Notes (Signed)
Pt states she has been having daily HA's x 2.  Pt also states some lower right side back pain.  Pt was recently see in MAU for HA's and was having some ctx.

## 2020-11-18 NOTE — Patient Instructions (Signed)
Sciatica Rehab Ask your health care provider which exercises are safe for you. Do exercises exactly as told by your health care provider and adjust them as directed. It is normal to feel mild stretching, pulling, tightness, or discomfort as you do these exercises. Stop right away if you feel sudden pain or your pain gets worse. Do not begin these exercises until told by your health care provider. Stretching and range-of-motion exercises These exercises warm up your muscles and joints and improve the movement and flexibility of your hips and back. These exercises also help to relieve pain, numbness, and tingling. Sciatic nerve glide 1. Sit in a chair with your head facing down toward your chest. Place your hands behind your back. Let your shoulders slump forward. 2. Slowly straighten one of your legs while you tilt your head back as if you are looking toward the ceiling. Only straighten your leg as far as you can without making your symptoms worse. 3. Hold this position for __________ seconds. 4. Slowly return to the starting position. 5. Repeat with your other leg. Repeat __________ times. Complete this exercise __________ times a day. Knee to chest with hip adduction and internal rotation 1. Lie on your back on a firm surface with both legs straight. 2. Bend one of your knees and move it up toward your chest until you feel a gentle stretch in your lower back and buttock. Then, move your knee toward the shoulder that is on the opposite side from your leg. This is hip adduction and internal rotation. ? Hold your leg in this position by holding on to the front of your knee. 3. Hold this position for __________ seconds. 4. Slowly return to the starting position. 5. Repeat with your other leg. Repeat __________ times. Complete this exercise __________ times a day.          Posture and body mechanics Good posture and healthy body mechanics can help to relieve stress in your body's tissues and  joints. Body mechanics refers to the movements and positions of your body while you do your daily activities. Posture is part of body mechanics. Good posture means:  Your spine is in its natural S-curve position (neutral).  Your shoulders are pulled back slightly.  Your head is not tipped forward. Follow these guidelines to improve your posture and body mechanics in your everyday activities. Standing  When standing, keep your spine neutral and your feet about hip width apart. Keep a slight bend in your knees. Your ears, shoulders, and hips should line up.  When you do a task in which you stand in one place for a long time, place one foot up on a stable object that is 2-4 inches (5-10 cm) high, such as a footstool. This helps keep your spine neutral.   Sitting  When sitting, keep your spine neutral and keep your feet flat on the floor. Use a footrest, if necessary, and keep your thighs parallel to the floor. Avoid rounding your shoulders, and avoid tilting your head forward.  When working at a desk or a computer, keep your desk at a height where your hands are slightly lower than your elbows. Slide your chair under your desk so you are close enough to maintain good posture.  When working at a computer, place your monitor at a height where you are looking straight ahead and you do not have to tilt your head forward or downward to look at the screen.   Resting  When lying down and  resting, avoid positions that are most painful for you.  If you have pain with activities such as sitting, bending, stooping, or squatting, lie in a position in which your body does not bend very much. For example, avoid curling up on your side with your arms and knees near your chest (fetal position).  If you have pain with activities such as standing for a long time or reaching with your arms, lie with your spine in a neutral position and bend your knees slightly. Try the following positions: ? Lying on your side  with a pillow between your knees. ? Lying on your back with a pillow under your knees. Lifting  When lifting objects, keep your feet at least shoulder width apart and tighten your abdominal muscles.  Bend your knees and hips and keep your spine neutral. It is important to lift using the strength of your legs, not your back. Do not lock your knees straight out.  Always ask for help to lift heavy or awkward objects.   This information is not intended to replace advice given to you by your health care provider. Make sure you discuss any questions you have with your health care provider. Document Revised: 09/23/2018 Document Reviewed: 06/23/2018 Elsevier Patient Education  2021 [employer].

## 2020-11-25 ENCOUNTER — Ambulatory Visit: Payer: BC Managed Care – PPO | Attending: Maternal & Fetal Medicine

## 2020-11-25 ENCOUNTER — Encounter: Payer: Self-pay | Admitting: *Deleted

## 2020-11-25 ENCOUNTER — Ambulatory Visit: Payer: BC Managed Care – PPO | Admitting: *Deleted

## 2020-11-25 ENCOUNTER — Other Ambulatory Visit: Payer: Self-pay

## 2020-11-25 VITALS — BP 133/71 | HR 89

## 2020-11-25 DIAGNOSIS — E669 Obesity, unspecified: Secondary | ICD-10-CM | POA: Diagnosis not present

## 2020-11-25 DIAGNOSIS — O281 Abnormal biochemical finding on antenatal screening of mother: Secondary | ICD-10-CM

## 2020-11-25 DIAGNOSIS — O30043 Twin pregnancy, dichorionic/diamniotic, third trimester: Secondary | ICD-10-CM | POA: Diagnosis present

## 2020-11-25 DIAGNOSIS — O30042 Twin pregnancy, dichorionic/diamniotic, second trimester: Secondary | ICD-10-CM | POA: Insufficient documentation

## 2020-11-25 DIAGNOSIS — Z148 Genetic carrier of other disease: Secondary | ICD-10-CM

## 2020-11-25 DIAGNOSIS — O99213 Obesity complicating pregnancy, third trimester: Secondary | ICD-10-CM

## 2020-11-25 DIAGNOSIS — Z3A31 31 weeks gestation of pregnancy: Secondary | ICD-10-CM

## 2020-11-25 DIAGNOSIS — O09293 Supervision of pregnancy with other poor reproductive or obstetric history, third trimester: Secondary | ICD-10-CM

## 2020-11-26 ENCOUNTER — Other Ambulatory Visit: Payer: Self-pay | Admitting: *Deleted

## 2020-11-26 DIAGNOSIS — O30043 Twin pregnancy, dichorionic/diamniotic, third trimester: Secondary | ICD-10-CM

## 2020-12-02 ENCOUNTER — Other Ambulatory Visit: Payer: Self-pay

## 2020-12-02 ENCOUNTER — Ambulatory Visit (INDEPENDENT_AMBULATORY_CARE_PROVIDER_SITE_OTHER): Payer: BC Managed Care – PPO | Admitting: Obstetrics & Gynecology

## 2020-12-02 VITALS — BP 117/75 | HR 106 | Wt 233.0 lb

## 2020-12-02 DIAGNOSIS — O30043 Twin pregnancy, dichorionic/diamniotic, third trimester: Secondary | ICD-10-CM

## 2020-12-02 DIAGNOSIS — O99013 Anemia complicating pregnancy, third trimester: Secondary | ICD-10-CM

## 2020-12-02 DIAGNOSIS — Z3A32 32 weeks gestation of pregnancy: Secondary | ICD-10-CM

## 2020-12-02 DIAGNOSIS — O0993 Supervision of high risk pregnancy, unspecified, third trimester: Secondary | ICD-10-CM

## 2020-12-02 DIAGNOSIS — O30042 Twin pregnancy, dichorionic/diamniotic, second trimester: Secondary | ICD-10-CM

## 2020-12-02 MED ORDER — FERROUS SULFATE 325 (65 FE) MG PO TABS: 325.0000 mg | ORAL_TABLET | ORAL | 3 refills | Status: AC

## 2020-12-02 MED ORDER — BUTALBITAL-APAP-CAFFEINE 50-325-40 MG PO TABS: 1.0000 | ORAL_TABLET | Freq: Four times a day (QID) | ORAL | 0 refills | Status: DC | PRN

## 2020-12-02 NOTE — Progress Notes (Signed)
+   Fetal movement. Pt states she has been having increased migraines mostly on the left side of her head for the last 3 weeks. Pt states she has tried tylenol without relief.

## 2020-12-02 NOTE — Progress Notes (Signed)
   PRENATAL VISIT NOTE  Subjective:  Audrey Butler is a 25 y.o. G3P1011 at [redacted]w[redacted]d being seen today for ongoing prenatal care.  She is currently monitored for the following issues for this high-risk pregnancy and has Anemia during pregnancy in third trimester; Supervision of high-risk pregnancy; Twin gestation in second trimester; Prior pregnancy complicated by PIH, antepartum, third trimester; [redacted] weeks gestation of pregnancy; Elevated blood pressure reading without diagnosis of hypertension; and Abnormal antenatal test on their problem list.  Patient reports headache.  Contractions: Irritability. Vag. Bleeding: None.  Movement: Present. Denies leaking of fluid.   The following portions of the patient's history were reviewed and updated as appropriate: allergies, current medications, past family history, past medical history, past social history, past surgical history and problem list.   Objective:   Vitals:   12/02/20 0945  BP: 117/75  Pulse: (!) 106  Weight: 233 lb (105.7 kg)    Fetal Status: Fetal Heart Rate (bpm): A: 140 B: 143   Movement: Present     General:  Alert, oriented and cooperative. Patient is in no acute distress.  Skin: Skin is warm and dry. No rash noted.   Cardiovascular: Normal heart rate noted  Respiratory: Normal respiratory effort, no problems with respiration noted  Abdomen: Soft, gravid, appropriate for gestational age.  Pain/Pressure: Present     Pelvic: Cervical exam deferred        Extremities: Normal range of motion.  Edema: None  Mental Status: Normal mood and affect. Normal behavior. Normal judgment and thought content.   Assessment and Plan:  Pregnancy: G3P1011 at [redacted]w[redacted]d 1. Anemia affecting pregnancy in third trimester F/u CBC - ferrous sulfate 325 (65 FE) MG tablet; Take 1 tablet (325 mg total) by mouth every other day.  Dispense: 30 tablet; Refill: 3 - CBC  2. Supervision of high risk pregnancy in third trimester Headache recurs after taking  Tylenol, left temporal no aura - butalbital-acetaminophen-caffeine (FIORICET) 50-325-40 MG tablet; Take 1-2 tablets by mouth every 6 (six) hours as needed for headache.  Dispense: 20 tablet; Refill: 0  3. Dichorionic diamniotic twin pregnancy in third trimester Concordant growth  4. Anemia during pregnancy in third trimester FeSO4 QOD  Preterm labor symptoms and general obstetric precautions including but not limited to vaginal bleeding, contractions, leaking of fluid and fetal movement were reviewed in detail with the patient. Please refer to After Visit Summary for other counseling recommendations.   Return in about 2 weeks (around 12/16/2020).  Future Appointments  Date Time Provider Department Center  12/17/2020  9:15 AM [facility] NST [facility]  12/23/2020  9:30 AM [facility] NURSE [facility]  12/23/2020  9:45 AM [facility] US4 [facility]  01/01/2021 10:15 AM [facility] NST [facility]  01/08/2021 10:15 AM [facility] NST [facility]    Scheryl Darter, MD

## 2020-12-03 LAB — CBC
Hematocrit: 30.5 % — ABNORMAL LOW (ref 34.0–46.6)
Hemoglobin: 9.7 g/dL — ABNORMAL LOW (ref 11.1–15.9)
MCH: 25.5 pg — ABNORMAL LOW (ref 26.6–33.0)
MCHC: 31.8 g/dL (ref 31.5–35.7)
MCV: 80 fL (ref 79–97)
Platelets: 204 10*3/uL (ref 150–450)
RBC: 3.8 x10E6/uL (ref 3.77–5.28)
RDW: 13.8 % (ref 11.7–15.4)
WBC: 4.6 10*3/uL (ref 3.4–10.8)

## 2020-12-14 DIAGNOSIS — O321XX0 Maternal care for breech presentation, not applicable or unspecified: Secondary | ICD-10-CM

## 2020-12-17 ENCOUNTER — Other Ambulatory Visit: Payer: BC Managed Care – PPO

## 2020-12-17 ENCOUNTER — Telehealth: Payer: Self-pay

## 2020-12-17 NOTE — Telephone Encounter (Signed)
Left message for patient:  Will need to have BPP in [facility] due to twin gestation per Diane D.    LM for patient to call office to reschedule this mornings appointment at [facility]

## 2020-12-18 NOTE — Telephone Encounter (Signed)
We can address this and get a note at her f/u visit

## 2020-12-19 ENCOUNTER — Encounter: Payer: BC Managed Care – PPO | Admitting: Obstetrics & Gynecology

## 2020-12-23 ENCOUNTER — Ambulatory Visit: Payer: BC Managed Care – PPO

## 2020-12-24 ENCOUNTER — Inpatient Hospital Stay (HOSPITAL_COMMUNITY)
Admission: AD | Admit: 2020-12-24 | Discharge: 2020-12-24 | Disposition: A | Discharge status: Home / Self Care | Payer: BC Managed Care – PPO | Attending: Obstetrics & Gynecology | Admitting: Obstetrics & Gynecology

## 2020-12-24 ENCOUNTER — Other Ambulatory Visit: Payer: Self-pay

## 2020-12-24 ENCOUNTER — Ambulatory Visit (INDEPENDENT_AMBULATORY_CARE_PROVIDER_SITE_OTHER): Payer: BC Managed Care – PPO

## 2020-12-24 DIAGNOSIS — O9089 Other complications of the puerperium, not elsewhere classified: Secondary | ICD-10-CM | POA: Diagnosis not present

## 2020-12-24 DIAGNOSIS — O165 Unspecified maternal hypertension, complicating the puerperium: Secondary | ICD-10-CM

## 2020-12-24 DIAGNOSIS — R030 Elevated blood-pressure reading, without diagnosis of hypertension: Secondary | ICD-10-CM | POA: Insufficient documentation

## 2020-12-24 DIAGNOSIS — Z7982 Long term (current) use of aspirin: Secondary | ICD-10-CM | POA: Insufficient documentation

## 2020-12-24 DIAGNOSIS — Z0130 Encounter for examination of blood pressure without abnormal findings: Secondary | ICD-10-CM

## 2020-12-24 DIAGNOSIS — Z20822 Contact with and (suspected) exposure to covid-19: Secondary | ICD-10-CM | POA: Diagnosis not present

## 2020-12-24 LAB — COMPREHENSIVE METABOLIC PANEL
ALT: 15 U/L (ref 0–44)
AST: 23 U/L (ref 15–41)
Albumin: 2.9 g/dL — ABNORMAL LOW (ref 3.5–5.0)
Alkaline Phosphatase: 103 U/L (ref 38–126)
Anion gap: 8 (ref 5–15)
BUN: 6 mg/dL (ref 6–20)
CO2: 25 mmol/L (ref 22–32)
Calcium: 8.5 mg/dL — ABNORMAL LOW (ref 8.9–10.3)
Chloride: 107 mmol/L (ref 98–111)
Creatinine, Ser: 0.75 mg/dL (ref 0.44–1.00)
GFR, Estimated: >60 mL/min (ref >60)
Glucose, Bld: 85 mg/dL (ref 70–99)
Potassium: 3.6 mmol/L (ref 3.5–5.1)
Sodium: 140 mmol/L (ref 135–145)
Total Bilirubin: 0.5 mg/dL (ref 0.3–1.2)
Total Protein: 5.6 g/dL — ABNORMAL LOW (ref 6.5–8.1)

## 2020-12-24 LAB — CBC
HCT: 27.1 % — ABNORMAL LOW (ref 36.0–46.0)
Hemoglobin: 8.1 g/dL — ABNORMAL LOW (ref 12.0–15.0)
MCH: 25 pg — ABNORMAL LOW (ref 26.0–34.0)
MCHC: 29.9 g/dL — ABNORMAL LOW (ref 30.0–36.0)
MCV: 83.6 fL (ref 80.0–100.0)
Platelets: 419 10*3/uL — ABNORMAL HIGH (ref 150–400)
RBC: 3.24 MIL/uL — ABNORMAL LOW (ref 3.87–5.11)
RDW: 16.7 % — ABNORMAL HIGH (ref 11.5–15.5)
WBC: 5.5 10*3/uL (ref 4.0–10.5)
nRBC: 0 % (ref 0.0–0.2)

## 2020-12-24 LAB — PROTEIN / CREATININE RATIO, URINE
Creatinine, Urine: 69.83 mg/dL
Total Protein, Urine: <6 mg/dL

## 2020-12-24 LAB — RESP PANEL BY RT-PCR (FLU A&B, COVID) ARPGX2
Influenza A by PCR: NEGATIVE
Influenza B by PCR: NEGATIVE
SARS Coronavirus 2 by RT PCR: NEGATIVE

## 2020-12-24 LAB — TYPE AND SCREEN
ABO/RH(D): O POS
Antibody Screen: NEGATIVE

## 2020-12-24 MED ORDER — NIFEDIPINE ER OSMOTIC RELEASE 30 MG PO TB24: 30.0000 mg | ORAL_TABLET | Freq: Every day | ORAL | 2 refills | Status: DC

## 2020-12-24 MED ORDER — LABETALOL HCL 5 MG/ML IV SOLN
40.0000 mg | INTRAVENOUS | Status: DC | PRN
  Administered 2020-12-24: 40 mg via INTRAVENOUS
  Filled 2020-12-24: qty 8

## 2020-12-24 MED ORDER — HYDRALAZINE HCL 20 MG/ML IJ SOLN: 10.0000 mg | INTRAMUSCULAR | Status: DC | PRN

## 2020-12-24 MED ORDER — LABETALOL HCL 5 MG/ML IV SOLN
20.0000 mg | INTRAVENOUS | Status: DC | PRN
  Administered 2020-12-24: 20 mg via INTRAVENOUS
  Filled 2020-12-24: qty 4

## 2020-12-24 MED ORDER — LABETALOL HCL 5 MG/ML IV SOLN
80.0000 mg | INTRAVENOUS | Status: DC | PRN
  Administered 2020-12-24: 80 mg via INTRAVENOUS
  Filled 2020-12-24: qty 16

## 2020-12-24 MED ORDER — NIFEDIPINE 10 MG PO CAPS
10.0000 mg | ORAL_CAPSULE | Freq: Once | ORAL | Status: AC
  Administered 2020-12-24: 10 mg via ORAL
  Filled 2020-12-24: qty 1

## 2020-12-24 MED ORDER — ACETAMINOPHEN 325 MG PO TABS
650.0000 mg | ORAL_TABLET | Freq: Once | ORAL | Status: AC
  Administered 2020-12-24: 650 mg via ORAL
  Filled 2020-12-24: qty 2

## 2020-12-24 NOTE — Progress Notes (Signed)
Patient was assessed and managed by nursing staff during this encounter. I have reviewed the chart and agree with the documentation and plan. I have also made any necessary editorial changes.  Catalina Antigua, MD 12/24/2020 2:02 PM

## 2020-12-24 NOTE — Progress Notes (Addendum)
Subjective:  Audrey Butler is a 25 y.o. female here for BP check.   Hypertension ROS: no TIA's, no chest pain on exertion, no dyspnea on exertion, no swelling of ankles, and   .    Objective:  BP (!) 158/108 (BP Location: Left Arm, Cuff Size: Large)   Pulse 60   Ht 5\' 11"  (1.803 m)   Wt 228 lb (103.4 kg)   LMP 04/20/2020 (Exact Date)   Breastfeeding Yes   BMI 31.80 kg/m   Appearance alert, well appearing, and in no distress. General exam BP noted to be very high today in office.     Assessment:   Blood Pressure  very high in Office today .  C/o headaches 5/10 and blurry vision x 2 days.  RA 158/99 PULSE 52 LA 158/108 PULSE 60 RA 157/100 PULSE 61  Plan:  Patient sent to MAU for further assessment of elevated BP per Dr. 13/11/2019.

## 2020-12-24 NOTE — MAU Note (Signed)
Audrey Butler is a 25 y.o. here in MAU reporting: s/p SVD and c/s on 12/14/2020. Pt delivered at [facility] in [place]. States she went into PTL. Was sent over from the office for HTN today. Declines any HTN during pregnancy. Has a mild headache currently. Previously has had some blurry vision but none currently. No RUQ pain.  Onset of complaint: today  Pain score: 2/10  Vitals:   12/24/20 0932  BP: (!) 162/107  Pulse: 64  Resp: 16  Temp: 98.1 F (36.7 C)  SpO2: 100%     Lab orders placed from triage: none

## 2020-12-24 NOTE — Progress Notes (Signed)
Subjective:     Audrey Butler is a 25 y.o. female who presents to the clinic 1 weeks status post  LTCS  for  Incision check . Eating a regular diet without difficulty. Bowel movements are normal. The patient is not having any pain.  Review of Systems Pertinent items are noted in HPI.    Objective:    BP (!) 158/108 (BP Location: Left Arm, Cuff Size: Large)   Pulse 60   Ht 5\' 11"  (1.803 m)   Wt 228 lb (103.4 kg)   LMP 04/20/2020 (Exact Date)   Breastfeeding Yes   BMI 31.80 kg/m  General:  alert  Abdomen: soft, bowel sounds active, non-tender  Incision:   healing well, no drainage, no erythema, no hernia, no seroma, no swelling, no dehiscence, incision well approximated     Assessment:    Doing well postoperatively.   Plan:    1. Continue any current medications. 2. Wound care discussed. 3. Activity restrictions: none 4. Anticipated return to work: not applicable. 5. Follow up: 4 weeks for PP visit.   13/11/2019, RMA

## 2020-12-24 NOTE — MAU Provider Note (Signed)
History     CSN: [id number]  Arrival date and time: 12/24/20 1583   Event Date/Time   First Provider Initiated Contact with Patient 12/24/20 [phone]      Chief Complaint  Patient presents with   Headache   Hypertension   HPI Audrey Butler is a 25 y.o. G3P1011 post-op patient who presents to MAU for evaluation of new onset elevated blood pressure. She is s/p cesarean with di/di twin gestation in the [facility] system in [place] on 12/14/2020. She presented to [facility] this morning for an incision check and was determined to have new onset near severe range BP 150s/100s. She was advised to present to MAU for workup. She also c/o intermittent blurry vision, onset about 3 days ago, resolved on arrival to MAU. She also endorses recurrent headache, pain score 1-2/10 on arrival to MAU. She denies visual disturbances, RUQ/epigastric pain, new onset swelling or weight gain. Patient has not eaten today.  Patient reports history of GHTN in her previous pregnancy but was normotensive throughout her most recent pregnancy and during her inpatient stay following her surgery.   She received prenatal care with [place].  OB History     Gravida  3   Para  1   Term  1   Preterm      AB  1   Living  1      SAB      IAB      Ectopic      Multiple  0   Live Births  1           Past Medical History:  Diagnosis Date   Pregnancy induced hypertension     Past Surgical History:  Procedure Laterality Date   NO PAST SURGERIES      Family History  Problem Relation Age of Onset   Obesity Neg Hx    Hypertension Neg Hx    Heart disease Neg Hx    Diabetes Neg Hx    Cancer Neg Hx     Social History   Tobacco Use   Smoking status: Never   Smokeless tobacco: Never  Vaping Use   Vaping Use: Never used  Substance Use Topics   Alcohol use: No   Drug use: No    Allergies: No Known Allergies  Medications Prior to Admission  Medication Sig Dispense Refill Last Dose    ferrous sulfate 325 (65 FE) MG tablet Take 1 tablet (325 mg total) by mouth every other day. 30 tablet 3 12/23/2020   Prenatal Vit-Fe Fumarate-FA (PREPLUS) 27-1 MG TABS Take 1 tablet by mouth daily. 30 tablet 13 12/23/2020   aspirin EC 81 MG tablet Take 1 tablet (81 mg total) by mouth daily. Swallow whole. 30 tablet 11    Blood Pressure Monitoring (BLOOD PRESSURE KIT) DEVI 1 kit by Does not apply route once a week. Check Blood Pressure regularly and record readings into the Babyscripts App.  Large Cuff.  DX O90.0 1 each 0    butalbital-acetaminophen-caffeine (FIORICET) 50-325-40 MG tablet Take 1-2 tablets by mouth every 6 (six) hours as needed for headache. 20 tablet 0     Review of Systems  Eyes:  Negative for photophobia and visual disturbance.  Neurological:  Positive for headaches.  All other systems reviewed and are negative. Physical Exam   Blood pressure (!) 148/97, pulse 65, temperature 98.1 F (36.7 C), temperature source Oral, resp. rate 16, height _0  (1.803 m), weight 103.8 kg, SpO2 100 %, currently breastfeeding.  Physical Exam Vitals and nursing note reviewed.  Constitutional:      Appearance: She is well-developed.  Cardiovascular:     Rate and Rhythm: Normal rate and regular rhythm.     Heart sounds: Normal heart sounds.  Pulmonary:     Effort: Pulmonary effort is normal.     Breath sounds: Normal breath sounds.  Abdominal:     Palpations: Abdomen is soft.  Skin:    General: Skin is warm and dry.     Capillary Refill: Capillary refill takes less than 2 seconds.     Comments: Lower abdominal incision is well-approximated, no streaking, purulent drainage. Steri strips intact.  Neurological:     Mental Status: She is alert and oriented to person, place, and time.     Deep Tendon Reflexes: Reflexes normal.  Psychiatric:        Behavior: Behavior normal.    MAU Course  Procedures  --Severe range pressures, headache on arrival to MAU. Labetalol Protocol ordered  right away, administered after second severe range BP per MAU protocol.   --1005: Update provided to Dr. Roselie Awkward. Hold Magnesium Sulfate for now, assess responsiveness to IV Labetalol. CNM returned to bedside  --1112: Pain score 0/10. Update   --CBC and CMET reassuring. Update provided t Dr. Roselie Awkward, who requests P:Cr. Will collect via straight cath due to postpartum lochia and concern for sample contamination. Orders also received for PO Procardia 10.   --Hgb  8.1. Lab results from birth visible in [place]. Hgb 8.3 at hospital discharge. Patient reports rx for oral Fe.   --BP normtensive following IV Labetalol and PO Procardia. PEC labs WNL. Headache resolved with Tylenol. No other severe features during MAU evaluation. Per Dr. Roselie Awkward, patient stable for d/c home on Procardia 30 XL daily with BP check in office on Thursday morning.  Orders Placed This Encounter  Procedures   Resp Panel by RT-PCR (Flu A&B, Covid) Nasopharyngeal Swab   CBC   Comprehensive metabolic panel   Protein / creatinine ratio, urine   Notify Physician   Measure blood pressure   In and Out Cath   Airborne and Contact precautions   Type and screen [place]   Blood draw with IV start   Discharge patient   Patient Vitals for the past 24 hrs:  BP Temp Temp src Pulse Resp SpO2 Height Weight  12/24/20 1327 133/72 98.1 F (36.7 C) -- 72 15 100 % -- --  12/24/20 1304 -- -- -- -- -- 99 % -- --  12/24/20 1300 137/76 -- -- 73 -- 99 % -- --  12/24/20 1255 -- -- -- -- -- 98 % -- --  12/24/20 1250 -- -- -- -- -- 100 % -- --  12/24/20 1245 133/80 -- -- 83 -- 98 % -- --  12/24/20 1240 125/73 -- -- 79 -- 99 % -- --  12/24/20 1230 -- -- -- -- -- 98 % -- --  12/24/20 1225 -- -- -- -- -- 98 % -- --  12/24/20 1220 -- -- -- -- -- 99 % -- --  12/24/20 1215 122/61 -- -- 95 -- 98 % -- --  12/24/20 1210 -- -- -- -- -- 98 % -- --  12/24/20 1205 -- -- -- -- -- 98 % -- --  12/24/20 1200 127/60 -- -- (!)  104 -- 100 % -- --  12/24/20 1155 -- -- -- -- -- 100 % -- --  12/24/20 1150 -- -- -- -- --  100 % -- --  12/24/20 1145 (!) 156/100 -- -- 60 -- 99 % -- --  12/24/20 1140 -- -- -- -- -- 100 % -- --  12/24/20 1135 (!) 155/93 -- -- -- -- 100 % -- --  12/24/20 1130 (!) 155/93 -- -- 69 -- 100 % -- --  12/24/20 1125 -- -- -- -- -- 100 % -- --  12/24/20 1120 -- -- -- -- -- 99 % -- --  12/24/20 1116 (!) 151/90 -- -- 68 -- -- -- --  12/24/20 1115 -- -- -- -- -- 100 % -- --  12/24/20 1110 -- -- -- -- -- 100 % -- --  12/24/20 1105 -- -- -- -- -- 99 % -- --  12/24/20 1100 (!) 164/98 -- -- 63 -- 100 % -- --  12/24/20 1055 -- -- -- -- -- 100 % -- --  12/24/20 1050 -- -- -- -- -- 99 % -- --  12/24/20 1045 (!) 157/96 -- -- 60 -- 100 % -- --  12/24/20 1035 -- -- -- -- -- 100 % -- --  12/24/20 1031 (!) 148/97 -- -- 65 -- -- -- --  12/24/20 1030 -- -- -- -- -- 100 % -- --  12/24/20 1025 -- -- -- -- -- 100 % -- --  12/24/20 1020 -- -- -- -- -- 99 % -- --  12/24/20 1015 (!) 160/100 -- -- 62 -- 100 % -- --  12/24/20 1010 -- -- -- -- -- 100 % -- --  12/24/20 1005 -- -- -- -- -- 100 % -- --  12/24/20 1000 (!) 165/100 -- -- 64 -- 100 % -- --  12/24/20 0955 -- -- -- -- -- 99 % -- --  12/24/20 0950 (!) 160/104 -- -- 66 -- 100 % -- --  12/24/20 0932 (!) 162/107 98.1 F (36.7 C) Oral 64 16 100 % -- --  12/24/20 0927 -- -- -- -- -- -- _0  (1.803 m) 103.8 kg   Results for orders placed or performed during the hospital encounter of 12/24/20 (from the past 24 hour(s))  CBC     Status: Abnormal   Collection Time: 12/24/20  9:55 AM  Result Value Ref Range   WBC 5.5 4.0 - 10.5 K/uL   RBC 3.24 (L) 3.87 - 5.11 MIL/uL   Hemoglobin 8.1 (L) 12.0 - 15.0 g/dL   HCT 27.1 (L) 36.0 - 46.0 %   MCV 83.6 80.0 - 100.0 fL   MCH 25.0 (L) 26.0 - 34.0 pg   MCHC 29.9 (L) 30.0 - 36.0 g/dL   RDW 16.7 (H) 11.5 - 15.5 %   Platelets 419 (H) 150 - 400 K/uL   nRBC 0.0 0.0 - 0.2 %  Comprehensive metabolic panel     Status: Abnormal    Collection Time: 12/24/20  9:55 AM  Result Value Ref Range   Sodium 140 135 - 145 mmol/L   Potassium 3.6 3.5 - 5.1 mmol/L   Chloride 107 98 - 111 mmol/L   CO2 25 22 - 32 mmol/L   Glucose, Bld 85 70 - 99 mg/dL   BUN 6 6 - 20 mg/dL   Creatinine, Ser 0.75 0.44 - 1.00 mg/dL   Calcium 8.5 (L) 8.9 - 10.3 mg/dL   Total Protein 5.6 (L) 6.5 - 8.1 g/dL   Albumin 2.9 (L) 3.5 - 5.0 g/dL   AST 23 15 - 41 U/L   ALT 15 0 - 44 U/L   Alkaline Phosphatase  103 38 - 126 U/L   Total Bilirubin 0.5 0.3 - 1.2 mg/dL   GFR, Estimated >60 >60 mL/min   Anion gap 8 5 - 15  Type and screen [place]     Status: None   Collection Time: 12/24/20 10:00 AM  Result Value Ref Range   ABO/RH(D) O POS    Antibody Screen NEG    Sample Expiration      12/27/2020,2359 Performed at [facility], [place] [address], [place], [place] [address]   Resp Panel by RT-PCR (Flu A&B, Covid) Nasopharyngeal Swab     Status: None   Collection Time: 12/24/20 10:09 AM   Specimen: Nasopharyngeal Swab; Nasopharyngeal(NP) swabs in vial transport medium  Result Value Ref Range   SARS Coronavirus 2 by RT PCR NEGATIVE NEGATIVE   Influenza A by PCR NEGATIVE NEGATIVE   Influenza B by PCR NEGATIVE NEGATIVE  Protein / creatinine ratio, urine     Status: None   Collection Time: 12/24/20 11:06 AM  Result Value Ref Range   Creatinine, Urine 69.83 mg/dL   Total Protein, Urine <6.0 mg/dL   Protein Creatinine Ratio        0.00 - 0.15 mg/mg[Cre]   Meds ordered this encounter  Medications   AND Linked Order Group    labetalol (NORMODYNE) injection 20 mg    labetalol (NORMODYNE) injection 40 mg    labetalol (NORMODYNE) injection 80 mg    hydrALAZINE (APRESOLINE) injection 10 mg   acetaminophen (TYLENOL) tablet 650 mg   NIFEdipine (PROCARDIA) capsule 10 mg   NIFEdipine (PROCARDIA-XL/NIFEDICAL-XL) 30 MG 24 hr tablet    Sig: Take 1 tablet (30 mg total) by mouth daily. Can increase to twice a day as needed for  symptomatic contractions    Dispense:  30 tablet    Refill:  2    Order Specific Question:   Supervising Provider    Answer:   Wood[place] Mode [[id number]]   Assessment and Plan  --25 y.o. s/p cesarean 12/14/2020 for di/di twins --Severe range BPs resolved with medications administered in MAU --PEC labs WNL --Per Dr. Roselie Awkward, initiate Procardia 30 XL daily --Continue oral Fe as previously prescribed --Discharge home in stable condition with Preeclampsia precautions  F/U: --In-basket sent to [place] to schedule patient for BP check 07/14  Darlina Rumpf, CNM 12/24/2020, 5:57 PM

## 2021-01-01 ENCOUNTER — Other Ambulatory Visit: Payer: BC Managed Care – PPO

## 2021-01-08 ENCOUNTER — Other Ambulatory Visit: Payer: BC Managed Care – PPO

## 2021-01-13 ENCOUNTER — Ambulatory Visit: Payer: BC Managed Care – PPO | Admitting: Family Medicine

## 2021-01-23 ENCOUNTER — Other Ambulatory Visit: Payer: Self-pay

## 2021-01-23 ENCOUNTER — Ambulatory Visit (INDEPENDENT_AMBULATORY_CARE_PROVIDER_SITE_OTHER): Payer: BC Managed Care – PPO | Admitting: Obstetrics and Gynecology

## 2021-01-23 ENCOUNTER — Encounter: Payer: Self-pay | Admitting: Obstetrics and Gynecology

## 2021-01-23 VITALS — BP 126/89 | HR 116 | Ht 71.0 in | Wt 214.0 lb

## 2021-01-23 DIAGNOSIS — Z01812 Encounter for preprocedural laboratory examination: Secondary | ICD-10-CM

## 2021-01-23 DIAGNOSIS — O285 Abnormal chromosomal and genetic finding on antenatal screening of mother: Secondary | ICD-10-CM

## 2021-01-23 DIAGNOSIS — Z30017 Encounter for initial prescription of implantable subdermal contraceptive: Secondary | ICD-10-CM | POA: Diagnosis not present

## 2021-01-23 DIAGNOSIS — O165 Unspecified maternal hypertension, complicating the puerperium: Secondary | ICD-10-CM | POA: Diagnosis not present

## 2021-01-23 DIAGNOSIS — O30042 Twin pregnancy, dichorionic/diamniotic, second trimester: Secondary | ICD-10-CM

## 2021-01-23 DIAGNOSIS — R87810 Cervical high risk human papillomavirus (HPV) DNA test positive: Secondary | ICD-10-CM

## 2021-01-23 DIAGNOSIS — Z9889 Other specified postprocedural states: Secondary | ICD-10-CM | POA: Diagnosis not present

## 2021-01-23 DIAGNOSIS — R87618 Other abnormal cytological findings on specimens from cervix uteri: Secondary | ICD-10-CM

## 2021-01-23 LAB — POCT URINE PREGNANCY: Preg Test, Ur: NEGATIVE

## 2021-01-23 MED ORDER — ETONOGESTREL 68 MG ~~LOC~~ IMPL
68.0000 mg | DRUG_IMPLANT | Freq: Once | SUBCUTANEOUS | Status: AC
  Administered 2021-01-23: 68 mg via SUBCUTANEOUS

## 2021-01-23 NOTE — Progress Notes (Signed)
     GYNECOLOGY OFFICE PROCEDURE NOTE  Audrey Butler is a 25 y.o. [phone] here for  Nexplanon insertion.  Last pap smear was on 06/2020 and was high risk HPV.  No other gynecologic concerns. Denies unprotected intercourse within the last 14 days. UPT: negative  Reviewed risks of insertion of implant including risk of infection, bleeding, damage to surrounding tissues and organs, migration of implant, difficult removal. She verbalizes understanding and affirms desire to proceed. Consent signed.   Nexplanon Insertion Procedure Patient identified, informed consent performed, consent signed.   Patient does understand that irregular bleeding is a very common side effect of this medication. She was advised to have backup contraception for one week after placement. Pregnancy test in clinic today was negative.  An adequate timeout was performed.  Patient's left arm was prepped and draped in the usual sterile fashion. The ruler used to measure and mark insertion area.  Patient was prepped with alcohol swab and then injected with 3 ml of 1% lidocaine.  She was prepped with betadine, Nexplanon removed from packaging,  Device confirmed in needle, then inserted full length of needle and withdrawn per handbook instructions. Nexplanon was able to palpated in the patient's arm; patient palpated the insert herself. There was minimal blood loss.  Patient insertion site covered with gauze and a pressure bandage to reduce any bruising.  The patient tolerated the procedure well and was given post procedure instructions.     Device Info Exp: 12/16/2022 Lot#: [id number]  K. Therese Sarah, MD, [facility]ending [facility] for [facility])

## 2021-01-23 NOTE — Progress Notes (Signed)
error 

## 2021-01-23 NOTE — Progress Notes (Signed)
Obstetrics/Postpartum Visit  Appointment Date: 01/23/2021  OBGYN Clinic: [facility]  Primary Care Provider: Pcp, No  Chief Complaint:  Chief Complaint  Patient presents with   Postpartum Care    History of Present Illness: Audrey Butler is a 25 y.o. African-American [id number] (No LMP recorded.), seen for the above chief complaint. Her past medical history is significant for PIH.   She is s/p SVD/1CS on 12/14/20 at ~34 weeks in [place] for di/di twins; she presented for pain and was found to be 9 cm dilated. Had vaginal delivery with Twin A, then Twin B was breech and emergency C-section performed. Op report not available. Patient reports uncomplicated immediate post partum course otherwise. Pregnancy complicated by di/di twins, elevated NIPs for T21. Presented 10 days postpartum to MAU for headache and newly elevated BP. Neg labs and was started on procardia 30 mg XL there.   Complains of nothing. Headaches have improved and generally she is feeling well. Stopped taking procardia 3 days ago. She is doing well mentally, has help at home. Mood is fine. No pain, no bleeding. Overall, feeling well. Getting enough sleep. Both babies are home and doing well, one does have down syndrome.   Vaginal bleeding or discharge: No  Breast or formula feeding: bottle Intercourse: No  Contraception: Nexplanon PP depression s/s: No  Any bowel or bladder issues: No  Pap smear:  negative cytology, +hrHPV, neg 16/18/45  (date: 06/2020)  Review of Systems: Positive for n/a.   Her 12 point review of systems is negative or as noted in the History of Present Illness.  Patient Active Problem List   Diagnosis Date Noted   Abnormal antenatal test 11/18/2020   Elevated blood pressure reading without diagnosis of hypertension 11/14/2020   [redacted] weeks gestation of pregnancy 11/01/2020   Twin gestation in second trimester 07/05/2020   Prior pregnancy complicated by [facility], antepartum, third trimester 07/05/2020    Supervision of high-risk pregnancy 07/02/2020   Anemia during pregnancy in third trimester 04/08/2017    Medications We administered etonogestrel. Current Outpatient Medications  Medication Sig Dispense Refill   NIFEdipine (PROCARDIA-XL/NIFEDICAL-XL) 30 MG 24 hr tablet Take 1 tablet (30 mg total) by mouth daily. Can increase to twice a day as needed for symptomatic contractions 30 tablet 2   Blood Pressure Monitoring (BLOOD PRESSURE KIT) DEVI 1 kit by Does not apply route once a week. Check Blood Pressure regularly and record readings into the Babyscripts App.  Large Cuff.  DX O90.0 (Patient not taking: Reported on 01/23/2021) 1 each 0   ferrous sulfate 325 (65 FE) MG tablet Take 1 tablet (325 mg total) by mouth every other day. (Patient not taking: Reported on 01/23/2021) 30 tablet 3   Prenatal Vit-Fe Fumarate-FA (PREPLUS) 27-1 MG TABS Take 1 tablet by mouth daily. (Patient not taking: Reported on 01/23/2021) 30 tablet 13   No current facility-administered medications for this visit.    Allergies Patient has no known allergies.  Physical Exam:  BP 126/89   Pulse (!) 116   Ht $R'5\' 11"'rT$  (1.803 m)   Wt 214 lb (97.1 kg)   Breastfeeding No   BMI 29.85 kg/m  Body mass index is 29.85 kg/m. General appearance: Well nourished, well developed female in no acute distress.  Cardiovascular: regular rate and rhythm Respiratory:  Normal respiratory effort Abdomen: no masses, hernias; diffusely non tender to palpation, non distended, well healed pfannenstiel incision Breasts: not examined. Neuro/Psych:  Normal mood and affect.  Skin:  Warm and dry.  PP Depression Screening:    Edinburgh Postnatal Depression Scale - 01/23/21 1111       Edinburgh Postnatal Depression Scale:  In the Past 7 Days   I have been able to laugh and see the funny side of things. 0    I have looked forward with enjoyment to things. 0    I have blamed myself unnecessarily when things went wrong. 0    I have been  anxious or worried for no good reason. 0    I have felt scared or panicky for no good reason. 0    Things have been getting on top of me. 0    I have been so unhappy that I have had difficulty sleeping. 0    I have felt sad or miserable. 0    I have been so unhappy that I have been crying. 0    The thought of harming myself has occurred to me. 0    Edinburgh Postnatal Depression Scale Total 0             Assessment: Patient is a 25 y.o. [id number] who is 5 weeks post partum from a vaginal delivery and primary c-section for preterm labor and 2nd twin breech. She is doing well.   Plan:   1. Abnormal chromosomal and genetic finding on antenatal screening mother One twin does have T21, doing well  2. Postpartum hypertension Has not been taking procardia the last 3 days Normotensive today Will stay off and return for BP check in 1 week  3. Postpartum state Doing well  4. Postoperative state Doing well  5. Dichorionic diamniotic twin pregnancy in third trimester  6. Pap smear abnormality of cervix/human papillomavirus (HPV) positive Repeat pap 06/2021  7. Nexplanon insertion See note for insertion today - POCT urine pregnancy   Essential components of care per ACOG recommendations:  1.  Mood and well being: Patient with negative depression screening today. Reviewed local resources for support.  - Patient does not use tobacco.  - hx of drug use? No    2. Infant care and feeding:  -Patient currently breastmilk feeding? No . -Social determinants of health (SDOH) reviewed in EPIC. No concerns  3. Sexuality, contraception and birth spacing - Patient does not want a pregnancy in the next year.  Desired family size is unknown children.  - Reviewed forms of contraception in tiered fashion. Patient desired  nexplanon  today.   - Discussed birth spacing of 18 months  4. Sleep and fatigue -Encouraged family/partner/community support of 4 hrs of uninterrupted sleep to help with  mood and fatigue  5. Physical Recovery  - Discussed patients delivery and complications - Patient had a no laceration, perineal healing reviewed. Patient expressed understanding - Patient has urinary incontinence? No  - Patient is safe to resume physical and sexual activity  6.  Health Maintenance - Last pap smear done 06/2020 and was with negative HPV. - repeat 1 year  7. Peripartum htn - will need PCP follow up   RTC 1 week for BP check   K. Arvilla Meres, MD, [place] for [facility])

## 2021-01-30 ENCOUNTER — Other Ambulatory Visit: Payer: Self-pay

## 2021-01-30 ENCOUNTER — Ambulatory Visit (INDEPENDENT_AMBULATORY_CARE_PROVIDER_SITE_OTHER): Payer: BC Managed Care – PPO

## 2021-01-30 VITALS — BP 145/93 | HR 91

## 2021-01-30 DIAGNOSIS — Z0130 Encounter for examination of blood pressure without abnormal findings: Secondary | ICD-10-CM

## 2021-01-30 NOTE — Progress Notes (Signed)
..  Subjective:  Audrey Butler is a 25 y.o. female here for BP check.   Hypertension ROS: not taking medications regularly as instructed, no TIA's, no chest pain on exertion, no dyspnea on exertion, and no swelling of ankles.    Objective:  BP (!) 145/93   Pulse 91   Appearance alert, well appearing, and in no distress. General exam BP noted to need improvement today in office.    Assessment:   Blood Pressure needs improvement.   Plan:  Consulted w/ Dr. Clearance Coots and advised pt to re-start Procardia and return in 1 week for BP check. Pt agreed. Marland Kitchen

## 2021-02-06 ENCOUNTER — Other Ambulatory Visit: Payer: Self-pay

## 2021-02-06 ENCOUNTER — Ambulatory Visit (INDEPENDENT_AMBULATORY_CARE_PROVIDER_SITE_OTHER): Payer: BC Managed Care – PPO

## 2021-02-06 VITALS — BP 157/108 | HR 99

## 2021-02-06 DIAGNOSIS — Z0130 Encounter for examination of blood pressure without abnormal findings: Secondary | ICD-10-CM

## 2021-02-06 NOTE — Progress Notes (Signed)
Patient was assessed and managed by nursing staff during this encounter. I have reviewed the chart and agree with the documentation and plan. I have also made any necessary editorial changes.  Warden Fillers, MD 02/06/2021 3:02 PM

## 2021-02-06 NOTE — Progress Notes (Signed)
..  Subjective:  Audrey Butler is a 25 y.o. female here for BP check.   Hypertension ROS: not taking medications regularly as instructed, no medication side effects noted, no TIA's, no chest pain on exertion, no dyspnea on exertion, and no swelling of ankles.    Objective:  BP (!) 157/108   Pulse 99   Appearance alert, well appearing, and in no distress. General exam BP noted to be poorly controlled today in office.    Assessment:   Blood Pressure poorly controlled.   Plan:  Per provider, advised pt to take previously prescribed Procardia daily at same time, return in 2 weeks for another BP check. Provided list for PCPs, and advised pt if abnormal symptoms occur to call or report to hospital, pt agreed .

## 2021-02-20 ENCOUNTER — Ambulatory Visit: Payer: BC Managed Care – PPO

## 2021-03-28 NOTE — Progress Notes (Deleted)
  Subjective:    Audrey Butler - 25 y.o. female MRN [id number]  Date of birth: December 19, 1995  HPI  Audrey Butler is to establish care.  Current issues and/or concerns:  ROS per HPI     Health Maintenance:  - *** Health Maintenance Due  Topic Date Due   COVID-19 Vaccine (1) Never done   HPV VACCINES (1 - 2-dose series) Never done   TETANUS/TDAP  Never done   INFLUENZA VACCINE  Never done     Past Medical History: Patient Active Problem List   Diagnosis Date Noted   Abnormal antenatal test 11/18/2020   Elevated blood pressure reading without diagnosis of hypertension 11/14/2020   [redacted] weeks gestation of pregnancy 11/01/2020   Twin gestation in second trimester 07/05/2020   Prior pregnancy complicated by [facility], antepartum, third trimester 07/05/2020   Supervision of high-risk pregnancy 07/02/2020   Anemia during pregnancy in third trimester 04/08/2017      Social History   reports that she has never smoked. She has never used smokeless tobacco. She reports that she does not drink alcohol and does not use drugs.   Family History  family history is not on file.   Medications: reviewed and updated   Objective:   Physical Exam There were no vitals taken for this visit. Physical Exam      Assessment & Plan:         Patient was given clear instructions to go to Emergency Department or return to medical center if symptoms don't improve, worsen, or new problems develop.The patient verbalized understanding.  I discussed the assessment and treatment plan with the patient. The patient was provided an opportunity to ask questions and all were answered. The patient agreed with the plan and demonstrated an understanding of the instructions.   The patient was advised to call back or seek an in-person evaluation if the symptoms worsen or if the condition fails to improve as anticipated.    Ricky Stabs, NP 03/28/2021, 2:58 PM Primary Care at [facility]

## 2021-04-04 ENCOUNTER — Ambulatory Visit: Payer: BC Managed Care – PPO | Admitting: Family Medicine

## 2021-04-04 ENCOUNTER — Ambulatory Visit: Payer: BC Managed Care – PPO | Admitting: Family

## 2021-04-04 DIAGNOSIS — Z7689 Persons encountering health services in other specified circumstances: Secondary | ICD-10-CM

## 2021-09-18 ENCOUNTER — Encounter: Payer: Self-pay | Admitting: Obstetrics

## 2021-09-18 ENCOUNTER — Ambulatory Visit (INDEPENDENT_AMBULATORY_CARE_PROVIDER_SITE_OTHER): Payer: BC Managed Care – PPO | Admitting: Obstetrics

## 2021-09-18 VITALS — BP 137/98 | HR 113 | Ht 71.0 in | Wt 230.3 lb

## 2021-09-18 DIAGNOSIS — I10 Essential (primary) hypertension: Secondary | ICD-10-CM

## 2021-09-18 DIAGNOSIS — Z30011 Encounter for initial prescription of contraceptive pills: Secondary | ICD-10-CM

## 2021-09-18 DIAGNOSIS — Z3009 Encounter for other general counseling and advice on contraception: Secondary | ICD-10-CM

## 2021-09-18 DIAGNOSIS — Z3046 Encounter for surveillance of implantable subdermal contraceptive: Secondary | ICD-10-CM

## 2021-09-18 DIAGNOSIS — B9689 Other specified bacterial agents as the cause of diseases classified elsewhere: Secondary | ICD-10-CM

## 2021-09-18 MED ORDER — METRONIDAZOLE 500 MG PO TABS: 500.0000 mg | ORAL_TABLET | Freq: Two times a day (BID) | ORAL | 2 refills | Status: DC

## 2021-09-18 MED ORDER — SLYND 4 MG PO TABS: 1.0000 | ORAL_TABLET | Freq: Every day | ORAL | 11 refills | Status: DC

## 2021-09-18 NOTE — Progress Notes (Signed)
NEXPLANON REMOVAL NOTE ? ?Date of LMP:   unknown ? ?Contraception used: *Nexplanon ? ? ?Indications:  The patient desires removal of Nexplanon because of irregular vaginal bleeding.  She understands risks, benefits, and alternatives to Implanon and would like to proceed. ? ?Anesthesia:   Lidocaine 1% plain. ? ?Procedure:  A time-out was performed confirming the procedure and the patient's allergy status.  Complications: None ?                     The rod was palpated and the area was sterilely prepped.  The area beneath the distal tip was anesthetized with 1% xylocaine and the skin incised                       Over the tip and the tip was exposed, grasped with forcep and removed intact.  A single suture of 4-0 Vicryl was used to close incision.  Steri strip  ?                     And a bandage applied and the arm was wrapped with gauze bandage.  The patient tolerated well.  ?Instructions:  The patient was instructed to remove the dressing in 24 hours and that some bruising is to be expected.  She was advised to use over the counter analgesics as needed for any pain at the site.  She is to keep the area dry for 24 hours and to call if her hand or arm becomes cold, numb, or blue. ? ?Return visit:  Return in 2 weeks  ? ?Brock Bad, MD ?09/18/2021 11:29 AM  ?

## 2021-09-18 NOTE — Progress Notes (Signed)
Patient presents for Nexplanon removal. Patient states that she would like to switch to the patch. Patient states that she does not like her bleeding with the nexplanon. ?

## 2021-10-02 ENCOUNTER — Encounter: Payer: Self-pay | Admitting: Obstetrics

## 2021-10-02 ENCOUNTER — Ambulatory Visit (INDEPENDENT_AMBULATORY_CARE_PROVIDER_SITE_OTHER): Payer: BC Managed Care – PPO | Admitting: Obstetrics

## 2021-10-02 VITALS — BP 144/101 | HR 85 | Ht 71.0 in | Wt 234.9 lb

## 2021-10-02 DIAGNOSIS — Z0130 Encounter for examination of blood pressure without abnormal findings: Secondary | ICD-10-CM | POA: Diagnosis not present

## 2021-10-02 DIAGNOSIS — I10 Essential (primary) hypertension: Secondary | ICD-10-CM

## 2021-10-02 DIAGNOSIS — J301 Allergic rhinitis due to pollen: Secondary | ICD-10-CM | POA: Diagnosis not present

## 2021-10-02 DIAGNOSIS — Z9889 Other specified postprocedural states: Secondary | ICD-10-CM | POA: Diagnosis not present

## 2021-10-02 MED ORDER — TRIAMTERENE-HCTZ 37.5-25 MG PO CAPS: 1.0000 | ORAL_CAPSULE | Freq: Every day | ORAL | 11 refills | Status: DC

## 2021-10-02 MED ORDER — LORATADINE 10 MG PO TABS: 10.0000 mg | ORAL_TABLET | Freq: Every day | ORAL | 11 refills | Status: DC

## 2021-10-02 MED ORDER — NIFEDIPINE ER OSMOTIC RELEASE 30 MG PO TB24: 30.0000 mg | ORAL_TABLET | Freq: Every day | ORAL | 11 refills | Status: DC

## 2021-10-02 NOTE — Progress Notes (Signed)
Pt in office for f/u after Nexplanon removal. Pt states BP was high today and has been high at home sometimes. No other issues at this time.  ?

## 2021-10-02 NOTE — Progress Notes (Signed)
Subjective:  ? ? Audrey Butler is a 26 y.o. female who presents for contraception counseling. The patient has no complaints today. The patient is sexually active. Pertinent past medical history: hypertension. ? ?The information documented in the HPI was reviewed and verified. ? ?Menstrual History: ?OB History   ? ? Gravida  ?3  ? Para  ?2  ? Term  ?1  ? Preterm  ?1  ? AB  ?1  ? Living  ?3  ?  ? ? SAB  ?   ? IAB  ?   ? Ectopic  ?   ? Multiple  ?1  ? Live Births  ?3  ?   ?  ?  ?  ? ?Patient's last menstrual period was 09/15/2021. ?  ?Patient Active Problem List  ? Diagnosis Date Noted  ? Abnormal antenatal test 11/18/2020  ? Elevated blood pressure reading without diagnosis of hypertension 11/14/2020  ? [redacted] weeks gestation of pregnancy 11/01/2020  ? Twin gestation in second trimester 07/05/2020  ? Prior pregnancy complicated by [facility], antepartum, third trimester 07/05/2020  ? Supervision of high-risk pregnancy 07/02/2020  ? Anemia during pregnancy in third trimester 04/08/2017  ? ?Past Medical History:  ?Diagnosis Date  ? Pregnancy induced hypertension   ?  ?Past Surgical History:  ?Procedure Laterality Date  ? CESAREAN SECTION  12/14/2020  ? NO PAST SURGERIES    ?  ? ?Current Outpatient Medications:  ?  Blood Pressure Monitoring (BLOOD PRESSURE KIT) DEVI, 1 kit by Does not apply route once a week. Check Blood Pressure regularly and record readings into the Babys[employer] App.  Large Cuff.  DX O90.0, Disp: 1 each, Rfl: 0 ?  Drospirenone (SLYND) 4 MG TABS, Take 1 tablet by mouth daily before breakfast., Disp: 28 tablet, Rfl: 11 ?  loratadine (CLARITIN) 10 MG tablet, Take 1 tablet (10 mg total) by mouth daily., Disp: 30 tablet, Rfl: 11 ?  triamterene-hydrochlorothiazide (DYAZIDE) 37.5-25 MG capsule, Take 1 each (1 capsule total) by mouth daily., Disp: 30 capsule, Rfl: 11 ?  ferrous sulfate 325 (65 FE) MG tablet, Take 1 tablet (325 mg total) by mouth every other day. (Patient not taking: Reported on 01/23/2021), Disp: 30  tablet, Rfl: 3 ?  NIFEdipine (PROCARDIA-XL/NIFEDICAL-XL) 30 MG 24 hr tablet, Take 1 tablet (30 mg total) by mouth daily. Can increase to twice a day as needed for symptomatic contractions, Disp: 30 tablet, Rfl: 11 ?  Prenatal Vit-Fe Fumarate-FA (PREPLUS) 27-1 MG TABS, Take 1 tablet by mouth daily. (Patient not taking: Reported on 01/23/2021), Disp: 30 tablet, Rfl: 13 ?No Known Allergies  ?Social History  ? ?Tobacco Use  ? Smoking status: Never  ? Smokeless tobacco: Never  ?Substance Use Topics  ? Alcohol use: No  ?  ?Family History  ?Problem Relation Age of Onset  ? Diabetes Maternal Grandfather   ? Obesity Neg Hx   ? Hypertension Neg Hx   ? Heart disease Neg Hx   ? Cancer Neg Hx   ?  ? ? ? ?Review of Systems ?Constitutional: negative for weight loss ?Genitourinary:negative for abnormal menstrual periods and vaginal discharge ? ? ?Objective:  ? ?BP (!) 144/101   Pulse 85   Ht 5\' 11"  (1.803 m)   Wt 234 lb 14.4 oz (106.5 kg)   LMP 09/15/2021   BMI 32.76 kg/m?  ?  ?General:   Alert and no distress  ?Skin:   no rash or abnormalities  ?Lungs:   clear to auscultation bilaterally  ?Heart:  regular rate and rhythm, S1, S2 normal, no murmur, click, rub or gallop  ?Left Upper Extremity:  Nexplanon removal site is clean, dry, intact and non tender ? ?Lab Review ?Urine pregnancy test ?Labs reviewed yes ?Radiologic studies reviewed no ? ?I have spent a total of 20 minutes of face-to-face time, excluding clinical staff time, reviewing notes and preparing to see patient, ordering tests and/or medications, and counseling the patient.  ? ?Assessment:  ? ? 26 y.o., discontinuing Nexplanon and starting oral progestin-only pill, no contraindications.   She does have hypertension that is not well controlled, and she has been started on medication and referred to a PCP to continue BP management. ? ?Plan:  ? ?1. Postoperative state ?- Nexplanon removal site is healing well ? ?2. HTN (hypertension), benign ?Rx: ?-  triamterene-hydrochlorothiazide (DYAZIDE) 37.5-25 MG capsule; Take 1 each (1 capsule total) by mouth daily.  Dispense: 30 capsule; Refill: 11 ?- NIFEdipine (PROCARDIA-XL/NIFEDICAL-XL) 30 MG 24 hr tablet; Take 1 tablet (30 mg total) by mouth daily. Can increase to twice a day as needed for symptomatic contractions  Dispense: 30 tablet; Refill: 11 ? ?3. BP check ?- not well controlled ?- BP medication adjusted ? ?4. Seasonal allergic rhinitis due to pollen ?Rx: ?- loratadine (CLARITIN) 10 MG tablet; Take 1 tablet (10 mg total) by mouth daily.  Dispense: 30 tablet; Refill: 11  ? ? ? All questions answered. ?Contraception: oral progesterone-only contraceptive. ?Discussed healthy lifestyle modifications. ?Follow up in 6 weeks.  Annual exam / Pap. ? ?Meds ordered this encounter  ?Medications  ? triamterene-hydrochlorothiazide (DYAZIDE) 37.5-25 MG capsule  ?  Sig: Take 1 each (1 capsule total) by mouth daily.  ?  Dispense:  30 capsule  ?  Refill:  11  ? NIFEdipine (PROCARDIA-XL/NIFEDICAL-XL) 30 MG 24 hr tablet  ?  Sig: Take 1 tablet (30 mg total) by mouth daily. Can increase to twice a day as needed for symptomatic contractions  ?  Dispense:  30 tablet  ?  Refill:  11  ? loratadine (CLARITIN) 10 MG tablet  ?  Sig: Take 1 tablet (10 mg total) by mouth daily.  ?  Dispense:  30 tablet  ?  Refill:  11  ? ? ? ?Shelly Bombard, MD ?10/02/2021 10:52 AM  ?

## 2021-10-15 ENCOUNTER — Ambulatory Visit (INDEPENDENT_AMBULATORY_CARE_PROVIDER_SITE_OTHER): Payer: BC Managed Care – PPO | Admitting: Family Medicine

## 2021-10-15 ENCOUNTER — Encounter: Payer: Self-pay | Admitting: Family Medicine

## 2021-10-15 VITALS — BP 118/86 | HR 89 | Temp 98.4°F | Ht 71.0 in | Wt 221.0 lb

## 2021-10-15 DIAGNOSIS — R002 Palpitations: Secondary | ICD-10-CM | POA: Diagnosis not present

## 2021-10-15 DIAGNOSIS — Z7689 Persons encountering health services in other specified circumstances: Secondary | ICD-10-CM | POA: Diagnosis not present

## 2021-10-15 DIAGNOSIS — K219 Gastro-esophageal reflux disease without esophagitis: Secondary | ICD-10-CM

## 2021-10-15 DIAGNOSIS — M546 Pain in thoracic spine: Secondary | ICD-10-CM

## 2021-10-15 DIAGNOSIS — I10 Essential (primary) hypertension: Secondary | ICD-10-CM | POA: Diagnosis not present

## 2021-10-15 LAB — CBC WITH DIFFERENTIAL/PLATELET
Basophils Absolute: 0 10*3/uL (ref 0.0–0.1)
Basophils Relative: 0.5 % (ref 0.0–3.0)
Eosinophils Absolute: 0.1 10*3/uL (ref 0.0–0.7)
Eosinophils Relative: 3.4 % (ref 0.0–5.0)
HCT: 40.4 % (ref 36.0–46.0)
Hemoglobin: 13.1 g/dL (ref 12.0–15.0)
Lymphocytes Relative: 34.6 % (ref 12.0–46.0)
Lymphs Abs: 1.4 10*3/uL (ref 0.7–4.0)
MCHC: 32.5 g/dL (ref 30.0–36.0)
MCV: 82.9 fl (ref 78.0–100.0)
Monocytes Absolute: 0.3 10*3/uL (ref 0.1–1.0)
Monocytes Relative: 8.3 % (ref 3.0–12.0)
Neutro Abs: 2.2 10*3/uL (ref 1.4–7.7)
Neutrophils Relative %: 53.2 % (ref 43.0–77.0)
Platelets: 274 10*3/uL (ref 150.0–400.0)
RBC: 4.87 Mil/uL (ref 3.87–5.11)
RDW: 13.8 % (ref 11.5–15.5)
WBC: 4.2 10*3/uL (ref 4.0–10.5)

## 2021-10-15 LAB — COMPREHENSIVE METABOLIC PANEL WITH GFR
ALT: 15 U/L (ref 0–35)
AST: 21 U/L (ref 0–37)
Albumin: 5 g/dL (ref 3.5–5.2)
Alkaline Phosphatase: 65 U/L (ref 39–117)
BUN: 19 mg/dL (ref 6–23)
CO2: 27 meq/L (ref 19–32)
Calcium: 10.5 mg/dL (ref 8.4–10.5)
Chloride: 100 meq/L (ref 96–112)
Creatinine, Ser: 1.06 mg/dL (ref 0.40–1.20)
GFR: 73.13 mL/min
Glucose, Bld: 105 mg/dL — ABNORMAL HIGH (ref 70–99)
Potassium: 4.5 meq/L (ref 3.5–5.1)
Sodium: 136 meq/L (ref 135–145)
Total Bilirubin: 0.7 mg/dL (ref 0.2–1.2)
Total Protein: 8.5 g/dL — ABNORMAL HIGH (ref 6.0–8.3)

## 2021-10-15 LAB — T4, FREE: Free T4: 0.87 ng/dL (ref 0.60–1.60)

## 2021-10-15 LAB — TSH: TSH: 1.29 u[IU]/mL (ref 0.35–5.50)

## 2021-10-15 MED ORDER — FAMOTIDINE 40 MG PO TABS: 40.0000 mg | ORAL_TABLET | Freq: Two times a day (BID) | ORAL | 2 refills | Status: DC

## 2021-10-15 NOTE — Assessment & Plan Note (Signed)
Demonstrated how she can check her pulse the next time she notices palpitations. She will look for triggers or any symptoms associated. Follow up if episodes become more frequent or if she has any chest pain, shortness of breath or new symptoms.  ?

## 2021-10-15 NOTE — Assessment & Plan Note (Signed)
BP is well controlled. Continue current medications. Follow up pending lab results or if her BP is no longer in goal range.  ?

## 2021-10-15 NOTE — Assessment & Plan Note (Signed)
Discussed that her pain moves around which is reassuring and speaks to this being due to a MSK etiology, most likely from carrying her 72 month old. Her exam is benign. Discussed using good body mechanics and limit strenuous upper body activity. May try Tylenol and heating pad. Follow up if worsening.  ?

## 2021-10-15 NOTE — Patient Instructions (Addendum)
I am prescribing you famotidine for acid reflux.  I recommend you take this 30 minutes before breakfast and 30 minutes before your evening meal. ? ?Avoid food and beverages that irritate your acid reflux.  Some common things are acid, spicy, carbonation and chocolate. ? ?I also recommend avoiding over-the-counter NSAIDs such as ibuprofen, Motrin, Aleve, Advil and aspirin. ? ?Do not eat and lay down for at least 2 to 3 hours. ? ?For your back and chest pains, I recommend trying Tylenol 500 mg 2-3 times daily for the next week or 2 and see if this helps.  You can also try heating pad. ? ?Follow-up if you are getting worse over the next couple of weeks or in 2 to 3 months if no improvement ? ? ? ?Food Choices for Gastroesophageal Reflux Disease, Adult ?When you have gastroesophageal reflux disease (GERD), the foods you eat and your eating habits are very important. Choosing the right foods can help ease your discomfort. Think about working with a food expert (dietitian) to help you make good choices. ?What are tips for following this plan? ?Reading food labels ?Look for foods that are low in saturated fat. Foods that may help with your symptoms include: ?Foods that have less than 5% of daily value (DV) of fat. ?Foods that have 0 grams of trans fat. ?Cooking ?Do not fry your food. ?Cook your food by baking, steaming, grilling, or broiling. These are all methods that do not need a lot of fat for cooking. ?To add flavor, try to use herbs that are low in spice and acidity. ?Meal planning ? ?Choose healthy foods that are low in fat, such as: ?Fruits and vegetables. ?Whole grains. ?Low-fat dairy products. ?Lean meats, fish, and poultry. ?Eat small meals often instead of eating 3 large meals each day. Eat your meals slowly in a place where you are relaxed. Avoid bending over or lying down until 2-3 hours after eating. ?Limit high-fat foods such as fatty meats or fried foods. ?Limit your intake of fatty foods, such as oils,  butter, and shortening. ?Avoid the following as told by your doctor: ?Foods that cause symptoms. These may be different for different people. Keep a food diary to keep track of foods that cause symptoms. ?Alcohol. ?Drinking a lot of liquid with meals. ?Eating meals during the 2-3 hours before bed. ?Lifestyle ?Stay at a healthy weight. Ask your doctor what weight is healthy for you. If you need to lose weight, work with your doctor to do so safely. ?Exercise for at least 30 minutes on 5 or more days each week, or as told by your doctor. ?Wear loose-fitting clothes. ?Do not smoke or use any products that contain nicotine or tobacco. If you need help quitting, ask your doctor. ?Sleep with the head of your bed higher than your feet. Use a wedge under the mattress or blocks under the bed frame to raise the head of the bed. ?Chew sugar-free gum after meals. ?What foods should eat? ? ?Eat a healthy, well-balanced diet of fruits, vegetables, whole grains, low-fat dairy products, lean meats, fish, and poultry. Each person is different. ?Foods that may cause symptoms in one person may not cause any symptoms in another person. Work with your doctor to find foods that are safe for you. ?The items listed above may not be a complete list of what you can eat and drink. Contact a food expert for more options. ?What foods should I avoid? ?Limiting some of these foods may help in managing  the symptoms of GERD. Everyone is different. Talk with a food expert or your doctor to help you find the exact foods to avoid, if any. ?Fruits ?Any fruits prepared with added fat. Any fruits that cause symptoms. For some people, this may include citrus fruits, such as oranges, grapefruit, pineapple, and lemons. ?Vegetables ?Deep-fried vegetables. [place] fries. Any vegetables prepared with added fat. Any vegetables that cause symptoms. For some people, this may include tomatoes and tomato products, chili peppers, onions and garlic, and  horseradish. ?Grains ?Pastries or quick breads with added fat. ?Meats and other proteins ?High-fat meats, such as fatty beef or pork, hot dogs, ribs, ham, sausage, salami, and bacon. Fried meat or protein, including fried fish and fried chicken. Nuts and nut butters, in large amounts. ?Dairy ?Whole milk and chocolate milk. Sour cream. Cream. Ice cream. Cream cheese. Milkshakes. ?Fats and oils ?Butter. Margarine. Shortening. Ghee. ?Beverages ?Coffee and tea, with or without caffeine. Carbonated beverages. Sodas. Energy drinks. Fruit juice made with acidic fruits, such as orange or grapefruit. Tomato juice. Alcoholic drinks. ?Sweets and desserts ?Chocolate and cocoa. Donuts. ?Seasonings and condiments ?Pepper. Peppermint and spearmint. Added salt. Any condiments, herbs, or seasonings that cause symptoms. For some people, this may include curry, hot sauce, or vinegar-based salad dressings. ?The items listed above may not be a complete list of what you should not eat and drink. Contact a food expert for more options. ?Questions to ask your doctor ?Diet and lifestyle changes are often the first steps that are taken to manage symptoms of GERD. If diet and lifestyle changes do not help, talk with your doctor about taking medicines. ?Where to find more information ?International Foundation for Gastrointestinal Disorders: aboutgerd.org ?Summary ?When you have GERD, food and lifestyle choices are very important in easing your symptoms. ?Eat small meals often instead of 3 large meals a day. Eat your meals slowly and in a place where you are relaxed. ?Avoid bending over or lying down until 2-3 hours after eating. ?Limit high-fat foods such as fatty meats or fried foods. ?This information is not intended to replace advice given to you by your health care provider. Make sure you discuss any questions you have with your health care provider. ?Document Revised: 12/11/2019 Document Reviewed: 12/11/2019 ?Elsevier Patient Education ?  [place]. ? ?

## 2021-10-15 NOTE — Progress Notes (Signed)
? ?New Patient Office Visit ? ?Subjective   ? ?Patient ID: Batsheva Stevick, female    DOB: March 18, 1996  Age: 26 y.o. MRN: [id number] ? ?CC:  ?Chief Complaint  ?Patient presents with  ? Establish Care  ?  palpitations since starting Procardia (a month ago) and heart burn and chest burn along with back pain since starting hydrochlorothiazide and slynd (2 weeks ago). Denies SOB.  ? ?Did go to urgent care for BV 3/27.  ? ? ?HPI ?Paulita Licklider presents to establish care.  ? ?States she has acid reflux. Notes a sour taste in her month and burning in her chest.  ?Tums is not working.  ?States she has made some changes to her diet and has cut out soda and cut back on red meat.  ? ?She has also noted intermittent fluttering sensation in her chest. States this started 2 weeks after starting on Procardia. Reports having 4-7 episodes per week which last briefly, a couple of seconds, and occur in the morning and at bedtime. No associated chest pain, shortness of breath, N/V.  ? ?She reports generalized back aches, mostly bilateral thoracic region but the pain does move.  ?She has 65 month old who she picks up frequently. No known injury.  ? ?Started on birth control pills 2 weeks ago. Denies any chance of pregnancy.  ? ?HTN- reports good compliance with medications.  ? ?Denies fever, chills, dizziness, chest pain, palpitations, shortness of breath, abdominal pain, N/V/D, urinary symptoms, LE edema.  ? ? ?Does not smoke or drink alcohol.  ? ? ? ?Outpatient Encounter Medications as of 10/15/2021  ?Medication Sig  ? Drospirenone (SLYND) 4 MG TABS Take 1 tablet by mouth daily before breakfast.  ? famotidine (PEPCID) 40 MG tablet Take 1 tablet (40 mg total) by mouth 2 (two) times daily.  ? NIFEdipine (PROCARDIA-XL/NIFEDICAL-XL) 30 MG 24 hr tablet Take 1 tablet (30 mg total) by mouth daily. Can increase to twice a day as needed for symptomatic contractions  ? triamterene-hydrochlorothiazide (DYAZIDE) 37.5-25 MG capsule Take 1 each  (1 capsule total) by mouth daily.  ? Blood Pressure Monitoring (BLOOD PRESSURE KIT) DEVI 1 kit by Does not apply route once a week. Check Blood Pressure regularly and record readings into the Babyscripts App.  Large Cuff.  DX O90.0  ? ferrous sulfate 325 (65 FE) MG tablet Take 1 tablet (325 mg total) by mouth every other day. (Patient not taking: Reported on 01/23/2021)  ? loratadine (CLARITIN) 10 MG tablet Take 1 tablet (10 mg total) by mouth daily. (Patient not taking: Reported on 10/15/2021)  ? [DISCONTINUED] Prenatal Vit-Fe Fumarate-FA (PREPLUS) 27-1 MG TABS Take 1 tablet by mouth daily. (Patient not taking: Reported on 01/23/2021)  ? ?No facility-administered encounter medications on file as of 10/15/2021.  ? ? ?Past Medical History:  ?Diagnosis Date  ? Pregnancy induced hypertension   ? ? ?Past Surgical History:  ?Procedure Laterality Date  ? CESAREAN SECTION  12/14/2020  ? NO PAST SURGERIES    ? ? ?Family History  ?Problem Relation Age of Onset  ? High blood pressure Mother   ? High blood pressure Father   ? Diabetes Maternal Grandfather   ? Obesity Neg Hx   ? Hypertension Neg Hx   ? Heart disease Neg Hx   ? Cancer Neg Hx   ? ? ?Social History  ? ?Socioeconomic History  ? Marital status: Single  ?  Spouse name: Not on file  ? Number of children: 1  ? Years  of education: Not on file  ? Highest education level: Not on file  ?Occupational History  ? Occupation: [employer]  ?Tobacco Use  ? Smoking status: Never  ? Smokeless tobacco: Never  ?Vaping Use  ? Vaping Use: Never used  ?Substance and Sexual Activity  ? Alcohol use: No  ? Drug use: No  ? Sexual activity: Yes  ?  Partners: Male  ?  Birth control/protection: None  ?Other Topics Concern  ? Not on file  ?Social History Narrative  ? Not on file  ? ?Social Determinants of Health  ? ?Financial Resource Strain: Not on file  ?Food Insecurity: Not on file  ?Transportation Needs: Not on file  ?Physical Activity: Not on file  ?Stress: Not on file  ?Social Connections: Not on  file  ?Intimate Partner Violence: Not on file  ? ? ?ROS ?Pertinent positives and negatives in the history of present illness. ? ?  ? ? ?Objective   ? ?BP 118/86 (BP Location: Left Arm, Patient Position: Sitting, Cuff Size: Large)   Pulse 89   Temp 98.4 ?F (36.9 ?C) (Temporal)   Ht $R'5\' 11"'Yd$  (1.803 m)   Wt 221 lb (100.2 kg)   LMP 10/13/2021   SpO2 99%   BMI 30.82 kg/m?  ? ?Physical Exam ?Constitutional:   ?   General: She is not in acute distress. ?   Appearance: She is not ill-appearing or diaphoretic.  ?Eyes:  ?   Conjunctiva/sclera: Conjunctivae normal.  ?   Pupils: Pupils are equal, round, and reactive to light.  ?Cardiovascular:  ?   Rate and Rhythm: Normal rate and regular rhythm.  ?   Pulses: Normal pulses.  ?Pulmonary:  ?   Effort: Pulmonary effort is normal.  ?   Breath sounds: Normal breath sounds.  ?Abdominal:  ?   General: Abdomen is flat. Bowel sounds are normal. There is no distension.  ?   Palpations: Abdomen is soft.  ?   Tenderness: There is no abdominal tenderness. There is no guarding or rebound.  ?Musculoskeletal:     ?   General: Normal range of motion.  ?   Cervical back: Normal, normal range of motion and neck supple.  ?   Thoracic back: No bony tenderness. Normal range of motion.  ?   Lumbar back: No bony tenderness. Normal range of motion.  ?   Comments: Good sensation and ROM of her back  ?Skin: ?   General: Skin is warm and dry.  ?Neurological:  ?   General: No focal deficit present.  ?   Mental Status: She is alert and oriented to person, place, and time.  ?Psychiatric:     ?   Mood and Affect: Mood normal.     ?   Behavior: Behavior normal.  ? ? ? ?  ? ?Assessment & Plan:  ? ?Problem List Items Addressed This Visit   ? ?  ? Cardiovascular and Mediastinum  ? Primary hypertension  ?  BP is well controlled. Continue current medications. Follow up pending lab results or if her BP is no longer in goal range.  ? ?  ?  ? Relevant Orders  ? CBC with Differential/Platelet (Completed)  ?  Comprehensive metabolic panel (Completed)  ?  ? Digestive  ? Gastroesophageal reflux disease - Primary  ?  Start on Pepcid twice daily as prescribed. Counseling on avoiding food and beverage triggers and other lifestyle modifications to help with GERD. Follow up if worsening or if no improvement in  2-3 months.  ? ?  ?  ? Relevant Medications  ? famotidine (PEPCID) 40 MG tablet  ?  ? Other  ? Bilateral thoracic back pain  ?  Discussed that her pain moves around which is reassuring and speaks to this being due to a MSK etiology, most likely from carrying her 23 month old. Her exam is benign. Discussed using good body mechanics and limit strenuous upper body activity. May try Tylenol and heating pad. Follow up if worsening.  ? ?  ?  ? Intermittent palpitations  ?  Demonstrated how she can check her pulse the next time she notices palpitations. She will look for triggers or any symptoms associated. Follow up if episodes become more frequent or if she has any chest pain, shortness of breath or new symptoms.  ? ?  ?  ? Relevant Orders  ? CBC with Differential/Platelet (Completed)  ? Comprehensive metabolic panel (Completed)  ? TSH (Completed)  ? T4, free (Completed)  ? ?Other Visit Diagnoses   ? ? Encounter to establish care      ? ?  ? ? ?No follow-ups on file.  ? ?Harland Dingwall, NP-C ? ? ?

## 2021-10-15 NOTE — Assessment & Plan Note (Signed)
Start on Pepcid twice daily as prescribed. Counseling on avoiding food and beverage triggers and other lifestyle modifications to help with GERD. Follow up if worsening or if no improvement in 2-3 months.  ?

## 2021-12-03 ENCOUNTER — Ambulatory Visit (INDEPENDENT_AMBULATORY_CARE_PROVIDER_SITE_OTHER): Payer: Medicaid Other | Admitting: Family Medicine

## 2021-12-03 ENCOUNTER — Encounter: Payer: Self-pay | Admitting: Family Medicine

## 2021-12-03 VITALS — BP 128/86 | HR 92 | Temp 97.8°F | Ht 71.0 in | Wt 226.0 lb

## 2021-12-03 DIAGNOSIS — R112 Nausea with vomiting, unspecified: Secondary | ICD-10-CM | POA: Diagnosis not present

## 2021-12-03 DIAGNOSIS — I10 Essential (primary) hypertension: Secondary | ICD-10-CM

## 2021-12-03 DIAGNOSIS — R0982 Postnasal drip: Secondary | ICD-10-CM

## 2021-12-03 MED ORDER — TRIAMTERENE-HCTZ 37.5-25 MG PO CAPS: 1.0000 | ORAL_CAPSULE | Freq: Every day | ORAL | 11 refills | Status: DC

## 2021-12-03 NOTE — Assessment & Plan Note (Signed)
Triamterene-hydrochlorothiazide 37.5-25 mg capsule refilled.  She reportedly has not taken her medication the past 3 days.  Encouraged good compliance with medication.

## 2021-12-03 NOTE — Assessment & Plan Note (Signed)
URI symptoms improving.  Try Mucinex and follow-up if worsening.

## 2021-12-03 NOTE — Progress Notes (Signed)
Subjective:    Patient ID: Audrey Butler, female    DOB: January 06, 1996, 26 y.o.   MRN: [id number]  Chief Complaint  Patient presents with   Adenopathy    Swollen lymph node on left side she would like checked out. States she has had stomach bug for about 2 days now.     HPI Patient is in today for a swollen and tender left anterior cervical lymph node that is improving.  States she was having nasal congestion, nasal drainage and a mild sore throat that has since resolved. Reports having postnasal drainage and often feels mucus in the back of her throat.  She is not currently taking any medication for this.  No fever, chills, headache, dizziness, chest pain, palpitations, shortness of breath.  States she had a 24-hour GI bug with nausea and vomiting.  Her son was also sick.  States she is no longer having nausea or vomiting but does not feel well-hydrated.  Denies abdominal pain or diarrhea.     Health Maintenance Due  Topic Date Due   HPV VACCINES (1 - 2-dose series) Never done   TETANUS/TDAP  Never done    Past Medical History:  Diagnosis Date   Pregnancy induced hypertension     Past Surgical History:  Procedure Laterality Date   CESAREAN SECTION  12/14/2020   NO PAST SURGERIES      Family History  Problem Relation Age of Onset   High blood pressure Mother    High blood pressure Father    Diabetes Maternal Grandfather    Obesity Neg Hx    Hypertension Neg Hx    Heart disease Neg Hx    Cancer Neg Hx     Social History   Socioeconomic History   Marital status: Single    Spouse name: Not on file   Number of children: 1   Years of education: Not on file   Highest education level: Not on file  Occupational History   Occupation: [employer]  Tobacco Use   Smoking status: Never   Smokeless tobacco: Never  Vaping Use   Vaping Use: Never used  Substance and Sexual Activity   Alcohol use: No   Drug use: No   Sexual activity: Yes    Partners: Male    Birth  control/protection: None  Other Topics Concern   Not on file  Social History Narrative   Not on file   Social Determinants of Health   Financial Resource Strain: Not on file  Food Insecurity: Not on file  Transportation Needs: Not on file  Physical Activity: Not on file  Stress: Not on file  Social Connections: Not on file  Intimate Partner Violence: Not on file    Outpatient Medications Prior to Visit  Medication Sig Dispense Refill   Blood Pressure Monitoring (BLOOD PRESSURE KIT) DEVI 1 kit by Does not apply route once a week. Check Blood Pressure regularly and record readings into the Babys[employer]s App.  Large Cuff.  DX O90.0 1 each 0   Drospirenone (SLYND) 4 MG TABS Take 1 tablet by mouth daily before breakfast. 28 tablet 11   famotidine (PEPCID) 40 MG tablet Take 1 tablet (40 mg total) by mouth 2 (two) times daily. 60 tablet 2   ferrous sulfate 325 (65 FE) MG tablet Take 1 tablet (325 mg total) by mouth every other day. 30 tablet 3   loratadine (CLARITIN) 10 MG tablet Take 1 tablet (10 mg total) by mouth daily. 30 tablet 11  NIFEdipine (PROCARDIA-XL/NIFEDICAL-XL) 30 MG 24 hr tablet Take 1 tablet (30 mg total) by mouth daily. Can increase to twice a day as needed for symptomatic contractions 30 tablet 11   triamterene-hydrochlorothiazide (DYAZIDE) 37.5-25 MG capsule Take 1 each (1 capsule total) by mouth daily. 30 capsule 11   No facility-administered medications prior to visit.    No Known Allergies  ROS     Objective:    Physical Exam Constitutional:      General: She is not in acute distress.    Appearance: Normal appearance. She is not ill-appearing.  HENT:     Nose:     Right Sinus: No maxillary sinus tenderness or frontal sinus tenderness.     Left Sinus: No maxillary sinus tenderness or frontal sinus tenderness.     Mouth/Throat:     Mouth: Mucous membranes are moist.  Eyes:     Conjunctiva/sclera: Conjunctivae normal.     Pupils: Pupils are equal, round,  and reactive to light.  Neck:     Thyroid: No thyroid tenderness.     Comments: Unable to palpate enlarged lymph node and no tenderness on palpation. Cardiovascular:     Rate and Rhythm: Normal rate and regular rhythm.  Pulmonary:     Effort: Pulmonary effort is normal.     Breath sounds: Normal breath sounds.  Abdominal:     General: Abdomen is flat. Bowel sounds are normal. There is no distension.     Palpations: Abdomen is soft.     Tenderness: There is no abdominal tenderness. There is no guarding or rebound.  Musculoskeletal:     Cervical back: Normal range of motion and neck supple.  Skin:    General: Skin is warm and dry.  Neurological:     Mental Status: She is alert and oriented to person, place, and time.     Cranial Nerves: Cranial nerves 2-12 are intact.     Motor: Motor function is intact.  Psychiatric:        Attention and Perception: Attention normal.        Mood and Affect: Mood normal.        Speech: Speech normal.        Thought Content: Thought content normal.     BP 128/86 (BP Location: Left Arm, Patient Position: Sitting, Cuff Size: Large)   Pulse 92   Temp 97.8 F (36.6 C) (Temporal)   Ht 5' 11" (1.803 m)   Wt 226 lb (102.5 kg)   LMP 11/28/2021   SpO2 98%   BMI 31.52 kg/m  Wt Readings from Last 3 Encounters:  12/03/21 226 lb (102.5 kg)  10/15/21 221 lb (100.2 kg)  10/02/21 234 lb 14.4 oz (106.5 kg)       Assessment & Plan:   Problem List Items Addressed This Visit       Cardiovascular and Mediastinum   HTN (hypertension), benign    Triamterene-hydrochlorothiazide 37.5-25 mg capsule refilled.  She reportedly has not taken her medication the past 3 days.  Encouraged good compliance with medication.      Relevant Medications   triamterene-hydrochlorothiazide (DYAZIDE) 37.5-25 MG capsule     Digestive   Nausea and vomiting    Reported 24 hour GI symptoms.  Her son also had the same symptoms.  Encouraged good hydration and follow-up if  symptoms return.        Other   Post-nasal drainage - Primary    URI symptoms improving.  Try Mucinex and follow-up if worsening.  I am having Audrey Butler maintain her Blood Pressure Kit, ferrous sulfate, Slynd, NIFEdipine, loratadine, famotidine, and triamterene-hydrochlorothiazide.  Meds ordered this encounter  Medications   triamterene-hydrochlorothiazide (DYAZIDE) 37.5-25 MG capsule    Sig: Take 1 each (1 capsule total) by mouth daily.    Dispense:  30 capsule    Refill:  11

## 2021-12-03 NOTE — Patient Instructions (Addendum)
Try taking Mucinex over the counter.   Increase hydration and may try mixing water with Pedialyte or Gatorade.   Follow up if not back to baseline in one week or if you are getting worse.

## 2021-12-03 NOTE — Assessment & Plan Note (Signed)
Reported 24 hour GI symptoms.  Her son also had the same symptoms.  Encouraged good hydration and follow-up if symptoms return.

## 2021-12-18 ENCOUNTER — Encounter: Payer: Self-pay | Admitting: Family Medicine

## 2021-12-19 ENCOUNTER — Other Ambulatory Visit: Payer: Self-pay | Admitting: *Deleted

## 2021-12-19 ENCOUNTER — Ambulatory Visit (INDEPENDENT_AMBULATORY_CARE_PROVIDER_SITE_OTHER): Payer: Medicaid Other | Admitting: Family Medicine

## 2021-12-19 ENCOUNTER — Encounter: Payer: Self-pay | Admitting: Family Medicine

## 2021-12-19 VITALS — BP 122/86 | HR 88 | Temp 98.0°F | Ht 71.0 in | Wt 222.0 lb

## 2021-12-19 DIAGNOSIS — Z30011 Encounter for initial prescription of contraceptive pills: Secondary | ICD-10-CM

## 2021-12-19 DIAGNOSIS — J0140 Acute pansinusitis, unspecified: Secondary | ICD-10-CM | POA: Diagnosis not present

## 2021-12-19 DIAGNOSIS — R590 Localized enlarged lymph nodes: Secondary | ICD-10-CM

## 2021-12-19 MED ORDER — FLUTICASONE PROPIONATE 50 MCG/ACT NA SUSP: 2.0000 | Freq: Every day | NASAL | 1 refills | Status: DC

## 2021-12-19 MED ORDER — AMOXICILLIN 875 MG PO TABS: 875.0000 mg | ORAL_TABLET | Freq: Two times a day (BID) | ORAL | 0 refills | Status: AC

## 2021-12-19 MED ORDER — SLYND 4 MG PO TABS: 1.0000 | ORAL_TABLET | Freq: Every day | ORAL | 11 refills | Status: DC

## 2021-12-19 NOTE — Assessment & Plan Note (Signed)
Discussed that this should resolve as the sinus infection resolves.  She will let me know if it is not back to baseline.

## 2021-12-19 NOTE — Patient Instructions (Signed)
Take the antibiotic as prescribed. I also prescribed a steroid nasal spray to help with congestion and ear pressure.    Obgyn Offices:   [facility] [address] [place], [place] [address] [phone]  [facility] Address: [address] [place], [place] [address] Phone: [phone]  [facility] [address] [place], [place] [address] Phone: [phone]  [facility] [address] [place], [place] [address] Phone: [phone]   You can call and schedule your Dentist appointment at any of the following offices:   [facility] Address: [address]  [place], [place] [address] Phone #: [phone]  [facility] Address: [address] [place], [place] [address] Phone # [phone]  [facility]  Address: [address]                                                                 [place], [place] [address] Phone #: [phone]

## 2021-12-19 NOTE — Progress Notes (Signed)
Reorder of pt bc pills.  Pt had been taking samples that were provided and needed updated Rx sent to pharmacy.

## 2021-12-19 NOTE — Progress Notes (Signed)
 Subjective:     Patient ID: Audrey Butler, female    DOB: 04/24/1996, 26 y.o.   MRN: [id number]  Chief Complaint  Patient presents with   Neck Pain    Left sided neck pain continuing since last visit. Also notes mucus in her throat.     Neck Pain    Patient is in today for nasal congestion, left ear pressure, post nasal drainage and improving left anterior neck tenderness x 3 wks.   Denies fever, chills, headache, dizziness, sore throat, chest pain, palpitations, shortness of breath, cough, abdominal pain, N/V/D.    Taking Mucinex.   Health Maintenance Due  Topic Date Due   HPV VACCINES (1 - 2-dose series) Never done   TETANUS/TDAP  Never done    Past Medical History:  Diagnosis Date   Abnormal antenatal test 11/18/2020   High risk T21, declined amnio   Pregnancy induced hypertension    Prior pregnancy complicated by PIH, antepartum, third trimester 07/05/2020   Elev BP x1 in MAU 11/14/20   Supervision of high-risk pregnancy 07/02/2020    Nursing Staff Provider  Office Location  [facility] Dating  LMP  Language  ENGLISH Anatomy US  08-30-20  Flu Vaccine  Declined 07/05/2020 Genetic Screen  NIPS:   AFP:   First Screen:  Quad:    TDaP Vaccine   Declined 11/01/20 Hgb A1C or  GTT Early  Third trimester   COVID Vaccine  NO   LAB RESULTS   Rhogam   Blood Type O/Positive/-- (01/21 1041)   Feeding Plan BOTTLE Antibody Negative (01/21 1041)  Cont   Twin gestation in second trimester 07/05/2020    Past Surgical History:  Procedure Laterality Date   CESAREAN SECTION  12/14/2020   NO PAST SURGERIES      Family History  Problem Relation Age of Onset   High blood pressure Mother    High blood pressure Father    Diabetes Maternal Grandfather    Obesity Neg Hx    Hypertension Neg Hx    Heart disease Neg Hx    Cancer Neg Hx     Social History   Socioeconomic History   Marital status: Single    Spouse name: Not on file   Number of children: 1   Years of education: Not on file    Highest education level: Not on file  Occupational History   Occupation: [employer]  Tobacco Use   Smoking status: Never   Smokeless tobacco: Never  Vaping Use   Vaping Use: Never used  Substance and Sexual Activity   Alcohol use: No   Drug use: No   Sexual activity: Yes    Partners: Male    Birth control/protection: None  Other Topics Concern   Not on file  Social History Narrative   Not on file   Social Determinants of Health   Financial Resource Strain: Not on file  Food Insecurity: Not on file  Transportation Needs: Not on file  Physical Activity: Not on file  Stress: Not on file  Social Connections: Not on file  Intimate Partner Violence: Not on file    Outpatient Medications Prior to Visit  Medication Sig Dispense Refill   Blood Pressure Monitoring (BLOOD PRESSURE KIT) DEVI 1 kit by Does not apply route once a week. Check Blood Pressure regularly and record readings into the Babyscripts App.  Large Cuff.  DX O90.0 1 each 0   Drospirenone (SLYND) 4 MG TABS Take 1 tablet by mouth daily before   breakfast. 28 tablet 11   famotidine (PEPCID) 40 MG tablet Take 1 tablet (40 mg total) by mouth 2 (two) times daily. 60 tablet 2   ferrous sulfate 325 (65 FE) MG tablet Take 1 tablet (325 mg total) by mouth every other day. 30 tablet 3   loratadine (CLARITIN) 10 MG tablet Take 1 tablet (10 mg total) by mouth daily. 30 tablet 11   NIFEdipine (PROCARDIA-XL/NIFEDICAL-XL) 30 MG 24 hr tablet Take 1 tablet (30 mg total) by mouth daily. Can increase to twice a day as needed for symptomatic contractions 30 tablet 11   triamterene-hydrochlorothiazide (DYAZIDE) 37.5-25 MG capsule Take 1 each (1 capsule total) by mouth daily. 30 capsule 11   No facility-administered medications prior to visit.    No Known Allergies  Review of Systems  Musculoskeletal:  Positive for neck pain.       Objective:    Physical Exam Constitutional:      General: She is not in acute distress.    Appearance:  She is not ill-appearing.  HENT:     Right Ear: Tympanic membrane and ear canal normal.     Left Ear: Tympanic membrane and ear canal normal.     Nose: Congestion present.     Mouth/Throat:     Mouth: Mucous membranes are moist.     Pharynx: Posterior oropharyngeal erythema present. No oropharyngeal exudate.  Eyes:     Conjunctiva/sclera: Conjunctivae normal.     Pupils: Pupils are equal, round, and reactive to light.  Neck:     Thyroid: No thyroid tenderness.     Comments: Mildly enlarged and tender left anterior cervical lymph node.  Cardiovascular:     Rate and Rhythm: Normal rate and regular rhythm.  Pulmonary:     Effort: Pulmonary effort is normal.     Breath sounds: Normal breath sounds.  Musculoskeletal:     Cervical back: Normal range of motion and neck supple.  Lymphadenopathy:     Cervical: Cervical adenopathy present.  Neurological:     Mental Status: She is alert.     BP 122/86 (BP Location: Left Arm, Patient Position: Sitting, Cuff Size: Large)   Pulse 88   Temp 98 F (36.7 C) (Temporal)   Ht 5' 11" (1.803 m)   Wt 222 lb (100.7 kg)   LMP 11/28/2021   SpO2 97%   BMI 30.96 kg/m  Wt Readings from Last 3 Encounters:  12/19/21 222 lb (100.7 kg)  12/03/21 226 lb (102.5 kg)  10/15/21 221 lb (100.2 kg)       Assessment & Plan:   Problem List Items Addressed This Visit       Respiratory   Acute non-recurrent pansinusitis - Primary    Amoxicillin and Flonase prescribed.  Discussed symptomatic treatment.  Increase water intake.  Follow-up if worsening or not back to baseline when she finishes the antibiotic.      Relevant Medications   amoxicillin (AMOXIL) 875 MG tablet   fluticasone (FLONASE) 50 MCG/ACT nasal spray     Immune and Lymphatic   Left cervical lymphadenopathy    Discussed that this should resolve as the sinus infection resolves.  She will let me know if it is not back to baseline.       I am having [name] start on  amoxicillin and fluticasone. I am also having her maintain her Blood Pressure Kit, ferrous sulfate, NIFEdipine, loratadine, famotidine, triamterene-hydrochlorothiazide, and Slynd.  Meds ordered this encounter  Medications   amoxicillin (AMOXIL) 875  MG tablet    Sig: Take 1 tablet (875 mg total) by mouth 2 (two) times daily for 10 days.    Dispense:  20 tablet    Refill:  0    Order Specific Question:   Supervising Provider    Answer:   CRAWFORD, ELIZABETH A [[id number]]   fluticasone (FLONASE) 50 MCG/ACT nasal spray    Sig: Place 2 sprays into both nostrils daily.    Dispense:  16 g    Refill:  1    Order Specific Question:   Supervising Provider    Answer:   CRAWFORD, ELIZABETH A [[id number]]    

## 2021-12-19 NOTE — Assessment & Plan Note (Signed)
Amoxicillin and Flonase prescribed.  Discussed symptomatic treatment.  Increase water intake.  Follow-up if worsening or not back to baseline when she finishes the antibiotic.

## 2022-03-13 ENCOUNTER — Encounter: Payer: Self-pay | Admitting: Family Medicine

## 2022-03-19 ENCOUNTER — Encounter: Payer: Self-pay | Admitting: Family Medicine

## 2022-03-19 ENCOUNTER — Telehealth (INDEPENDENT_AMBULATORY_CARE_PROVIDER_SITE_OTHER): Payer: BC Managed Care – PPO | Admitting: Family Medicine

## 2022-03-19 DIAGNOSIS — H6692 Otitis media, unspecified, left ear: Secondary | ICD-10-CM | POA: Diagnosis not present

## 2022-03-19 DIAGNOSIS — J301 Allergic rhinitis due to pollen: Secondary | ICD-10-CM | POA: Diagnosis not present

## 2022-03-19 MED ORDER — LORATADINE 10 MG PO TABS: 10.0000 mg | ORAL_TABLET | Freq: Every day | ORAL | 5 refills | Status: DC

## 2022-03-19 MED ORDER — AMOXICILLIN 875 MG PO TABS: 875.0000 mg | ORAL_TABLET | Freq: Two times a day (BID) | ORAL | 0 refills | Status: AC

## 2022-03-19 NOTE — Progress Notes (Signed)
MyChart Video Visit    Virtual Visit via Video Note   This visit type was conducted due to national recommendations for restrictions regarding the COVID-19 Pandemic (e.g. social distancing) in an effort to limit this patient's exposure and mitigate transmission in our community. This patient is at least at moderate risk for complications without adequate follow up. This format is felt to be most appropriate for this patient at this time. Physical exam was limited by quality of the video and audio technology used for the visit. CMA was able to get the patient set up on a video visit.  Patient location: Home. Patient and provider in visit Provider location: Office  I discussed the limitations of evaluation and management by telemedicine and the availability of in person appointments. The patient expressed understanding and agreed to proceed.  Visit Date: 03/19/2022  Today's healthcare provider: Harland Dingwall, NP-C     Subjective:    Patient ID: Audrey Butler, female    DOB: 05/31/1996, 26 y.o.   MRN: [id number]  Chief Complaint  Patient presents with   Ear Pain    HPI  C/o left ear pain x 2 weeks.  States she has been using Flonase.  She is having postnasal drainage and requests refill of Claritin.  States both of her children have had ear infections recently and had to be treated.  She thinks she may have an ear infection.  Denies fever, chills, headache, dizziness, nasal congestion, sore throat, cough, abdominal pain, nausea, vomiting, diarrhea.   Past Medical History:  Diagnosis Date   Abnormal antenatal test 11/18/2020   High risk T21, declined amnio   Pregnancy induced hypertension    Prior pregnancy complicated by PIH, antepartum, third trimester 07/05/2020   Elev BP x1 in MAU 11/14/20   Supervision of high-risk pregnancy 07/02/2020    Nursing Staff Provider  Office Location  [facility] Dating  LMP  Language  ENGLISH Anatomy US  08-30-20  Flu Vaccine  Declined 07/05/2020  Genetic Screen  NIPS:   AFP:   First Screen:  Quad:    TDaP Vaccine   Declined 11/01/20 Hgb A1C or  GTT Early  Third trimester   COVID Vaccine  NO   LAB RESULTS   Rhogam   Blood Type O/Positive/-- (01/21 1041)   Feeding Plan BOTTLE Antibody Negative (01/21 1041)  Cont   Twin gestation in second trimester 07/05/2020    Past Surgical History:  Procedure Laterality Date   CESAREAN SECTION  12/14/2020   NO PAST SURGERIES      Family History  Problem Relation Age of Onset   High blood pressure Mother    High blood pressure Father    Diabetes Maternal Grandfather    Obesity Neg Hx    Hypertension Neg Hx    Heart disease Neg Hx    Cancer Neg Hx     Social History   Socioeconomic History   Marital status: Single    Spouse name: Not on file   Number of children: 1   Years of education: Not on file   Highest education level: Not on file  Occupational History   Occupation: [employer]  Tobacco Use   Smoking status: Never   Smokeless tobacco: Never  Vaping Use   Vaping Use: Never used  Substance and Sexual Activity   Alcohol use: No   Drug use: No   Sexual activity: Yes    Partners: Male    Birth control/protection: None  Other Topics Concern  Not on file  Social History Narrative   Not on file   Social Determinants of Health   Financial Resource Strain: Not on file  Food Insecurity: Not on file  Transportation Needs: Not on file  Physical Activity: Not on file  Stress: Not on file  Social Connections: Not on file  Intimate Partner Violence: Not on file    Outpatient Medications Prior to Visit  Medication Sig Dispense Refill   Blood Pressure Monitoring (BLOOD PRESSURE KIT) DEVI 1 kit by Does not apply route once a week. Check Blood Pressure regularly and record readings into the Babyscripts App.  Large Cuff.  DX O90.0 1 each 0   Drospirenone (SLYND) 4 MG TABS Take 1 tablet by mouth daily before breakfast. 28 tablet 11   famotidine (PEPCID) 40 MG tablet Take 1 tablet (40  mg total) by mouth 2 (two) times daily. 60 tablet 2   ferrous sulfate 325 (65 FE) MG tablet Take 1 tablet (325 mg total) by mouth every other day. 30 tablet 3   fluticasone (FLONASE) 50 MCG/ACT nasal spray Place 2 sprays into both nostrils daily. 16 g 1   NIFEdipine (PROCARDIA-XL/NIFEDICAL-XL) 30 MG 24 hr tablet Take 1 tablet (30 mg total) by mouth daily. Can increase to twice a day as needed for symptomatic contractions 30 tablet 11   triamterene-hydrochlorothiazide (DYAZIDE) 37.5-25 MG capsule Take 1 each (1 capsule total) by mouth daily. 30 capsule 11   loratadine (CLARITIN) 10 MG tablet Take 1 tablet (10 mg total) by mouth daily. 30 tablet 11   No facility-administered medications prior to visit.    No Known Allergies  ROS     Objective:    Physical Exam  There were no vitals taken for this visit. Wt Readings from Last 3 Encounters:  12/19/21 222 lb (100.7 kg)  12/03/21 226 lb (102.5 kg)  10/15/21 221 lb (100.2 kg)   Alert and oriented in no acute distress.  Respirations unlabored.  Normal mood.    Assessment & Plan:   Problem List Items Addressed This Visit   None Visit Diagnoses     Acute otitis media, left    -  Primary   Relevant Medications   amoxicillin (AMOXIL) 875 MG tablet   Seasonal allergic rhinitis due to pollen       Relevant Medications   loratadine (CLARITIN) 10 MG tablet      Amoxicillin prescribed.  Recommend starting back on Claritin and continuing Flonase.  She will follow-up if getting worse or not back to baseline when she completes the antibiotic.  I am having Audrey Butler start on amoxicillin. I am also having her maintain her Blood Pressure Kit, ferrous sulfate, NIFEdipine, famotidine, triamterene-hydrochlorothiazide, Slynd, fluticasone, and loratadine.  Meds ordered this encounter  Medications   loratadine (CLARITIN) 10 MG tablet    Sig: Take 1 tablet (10 mg total) by mouth daily.    Dispense:  30 tablet    Refill:  5    Order  Specific Question:   Supervising Provider    Answer:   Pricilla Holm A [[id number]]   amoxicillin (AMOXIL) 875 MG tablet    Sig: Take 1 tablet (875 mg total) by mouth 2 (two) times daily for 10 days.    Dispense:  20 tablet    Refill:  0    Order Specific Question:   Supervising Provider    Answer:   Pricilla Holm A [[id number]]    I discussed the assessment and treatment plan with the  patient. The patient was provided an opportunity to ask questions and all were answered. The patient agreed with the plan and demonstrated an understanding of the instructions.   The patient was advised to call back or seek an in-person evaluation if the symptoms worsen or if the condition fails to improve as anticipated.  I provided 16 minutes of face-to-face time during this encounter.   Harland Dingwall, NP-C [facility] [place] [phone] (phone) [phone] (fax)  [place]

## 2022-04-27 ENCOUNTER — Encounter: Payer: Self-pay | Admitting: Family Medicine

## 2022-07-31 ENCOUNTER — Telehealth: Payer: BC Managed Care – PPO | Admitting: Family Medicine

## 2022-07-31 ENCOUNTER — Encounter: Payer: Self-pay | Admitting: Family Medicine

## 2022-07-31 DIAGNOSIS — J0140 Acute pansinusitis, unspecified: Secondary | ICD-10-CM

## 2022-07-31 DIAGNOSIS — J0100 Acute maxillary sinusitis, unspecified: Secondary | ICD-10-CM | POA: Diagnosis not present

## 2022-07-31 MED ORDER — FLUTICASONE PROPIONATE 50 MCG/ACT NA SUSP: 2.0000 | Freq: Every day | NASAL | 1 refills | Status: DC

## 2022-07-31 MED ORDER — AMOXICILLIN-POT CLAVULANATE 875-125 MG PO TABS: 1.0000 | ORAL_TABLET | Freq: Two times a day (BID) | ORAL | 0 refills | Status: DC

## 2022-07-31 NOTE — Patient Instructions (Signed)

## 2022-07-31 NOTE — Progress Notes (Signed)
Virtual Visit Consent   [name], you are scheduled for a virtual visit with a [facility] provider today. Just as with appointments in the office, your consent must be obtained to participate. Your consent will be active for this visit and any virtual visit you may have with one of our providers in the next 365 days. If you have a MyChart account, a copy of this consent can be sent to you electronically.  As this is a virtual visit, video technology does not allow for your provider to perform a traditional examination. This may limit your provider's ability to fully assess your condition. If your provider identifies any concerns that need to be evaluated in person or the need to arrange testing (such as labs, EKG, etc.), we will make arrangements to do so. Although advances in technology are sophisticated, we cannot ensure that it will always work on either your end or our end. If the connection with a video visit is poor, the visit may have to be switched to a telephone visit. With either a video or telephone visit, we are not always able to ensure that we have a secure connection.  By engaging in this virtual visit, you consent to the provision of healthcare and authorize for your insurance to be billed (if applicable) for the services provided during this visit. Depending on your insurance coverage, you may receive a charge related to this service.  I need to obtain your verbal consent now. Are you willing to proceed with your visit today? Lynell Benzie has provided verbal consent on 07/31/2022 for a virtual visit (video or telephone). Dellia Nims, FNP  Date: 07/31/2022 3:08 PM  Virtual Visit via Video Note   I, Dellia Nims, connected with  Melica Krupnick  ([id number], 12/14/1995) on 07/31/22 at  3:00 PM EST by a video-enabled telemedicine application and verified that I am speaking with the correct person using two identifiers.  Location: Patient: Virtual Visit Location Patient:  Home Provider: Virtual Visit Location Provider: Home Office   I discussed the limitations of evaluation and management by telemedicine and the availability of in person appointments. The patient expressed understanding and agreed to proceed.    History of Present Illness: Audrey Butler is a 27 y.o. who identifies as a female who was assigned female at birth, and is being seen today for frontal sinus [place] and pressure, headache, no fever, has post nasal drainage. No cough. .Sx present for a week worsening.     HPI: HPI  Problems:  Patient Active Problem List   Diagnosis Date Noted   Acute non-recurrent pansinusitis 12/19/2021   Left cervical lymphadenopathy 12/19/2021   Post-nasal drainage 12/03/2021   Nausea and vomiting 12/03/2021   Gastroesophageal reflux disease 10/15/2021   Bilateral thoracic back pain 10/15/2021   HTN (hypertension), benign 10/15/2021   Intermittent palpitations 10/15/2021   Elevated blood pressure reading without diagnosis of hypertension 11/14/2020   Anemia during pregnancy in third trimester 04/08/2017    Allergies: No Known Allergies Medications:  Current Outpatient Medications:    amoxicillin-clavulanate (AUGMENTIN) 875-125 MG tablet, Take 1 tablet by mouth 2 (two) times daily., Disp: 20 tablet, Rfl: 0   Blood Pressure Monitoring (BLOOD PRESSURE KIT) DEVI, 1 kit by Does not apply route once a week. Check Blood Pressure regularly and record readings into the Babyscripts App.  Large Cuff.  DX O90.0, Disp: 1 each, Rfl: 0   Drospirenone (SLYND) 4 MG TABS, Take 1 tablet by mouth daily before breakfast., Disp: 28 tablet,  Rfl: 11   famotidine (PEPCID) 40 MG tablet, Take 1 tablet (40 mg total) by mouth 2 (two) times daily., Disp: 60 tablet, Rfl: 2   ferrous sulfate 325 (65 FE) MG tablet, Take 1 tablet (325 mg total) by mouth every other day., Disp: 30 tablet, Rfl: 3   fluticasone (FLONASE) 50 MCG/ACT nasal spray, Place 2 sprays into both nostrils daily., Disp:  16 g, Rfl: 1   loratadine (CLARITIN) 10 MG tablet, Take 1 tablet (10 mg total) by mouth daily., Disp: 30 tablet, Rfl: 5   NIFEdipine (PROCARDIA-XL/NIFEDICAL-XL) 30 MG 24 hr tablet, Take 1 tablet (30 mg total) by mouth daily. Can increase to twice a day as needed for symptomatic contractions, Disp: 30 tablet, Rfl: 11   triamterene-hydrochlorothiazide (DYAZIDE) 37.5-25 MG capsule, Take 1 each (1 capsule total) by mouth daily., Disp: 30 capsule, Rfl: 11  Observations/Objective: Patient is well-developed, well-nourished in no acute distress.  Resting comfortably  at home.  Head is normocephalic, atraumatic.  No labored breathing.  Speech is clear and coherent with logical content.  Patient is alert and oriented at baseline.    Assessment and Plan: 1. Acute maxillary sinusitis, recurrence not specified  2. Acute non-recurrent pansinusitis - fluticasone (FLONASE) 50 MCG/ACT nasal spray; Place 2 sprays into both nostrils daily.  Dispense: 16 g; Refill: 1  Increase fluids, humidifier at night, ibuprofen as directed, rtc as needed.   Follow Up Instructions: I discussed the assessment and treatment plan with the patient. The patient was provided an opportunity to ask questions and all were answered. The patient agreed with the plan and demonstrated an understanding of the instructions.  A copy of instructions were sent to the patient via MyChart unless otherwise noted below.     The patient was advised to call back or seek an in-person evaluation if the symptoms worsen or if the condition fails to improve as anticipated.  Time:  I spent 10 minutes with the patient via telehealth technology discussing the above problems/concerns.    Dellia Nims, FNP

## 2022-08-22 ENCOUNTER — Telehealth: Payer: Medicaid Other | Admitting: Nurse Practitioner

## 2022-08-22 DIAGNOSIS — J029 Acute pharyngitis, unspecified: Secondary | ICD-10-CM | POA: Diagnosis not present

## 2022-08-22 NOTE — Patient Instructions (Signed)
  [name], thank you for joining Chevis Pretty, FNP for today's virtual visit.  While this provider is not your primary care provider (PCP), if your PCP is located in our provider database this encounter information will be shared with them immediately following your visit.   [facility] account gives you access to today's visit and all your visits, tests, and labs performed at [facility] " click here if you don't have a [facility] account or go to [email or web address]  Consent: (Patient) Audrey Butler provided verbal consent for this virtual visit at the beginning of the encounter.  Current Medications:  Current Outpatient Medications:    amoxicillin-clavulanate (AUGMENTIN) 875-125 MG tablet, Take 1 tablet by mouth 2 (two) times daily., Disp: 20 tablet, Rfl: 0   Blood Pressure Monitoring (BLOOD PRESSURE KIT) DEVI, 1 kit by Does not apply route once a week. Check Blood Pressure regularly and record readings into the Babyscripts App.  Large Cuff.  DX O90.0, Disp: 1 each, Rfl: 0   Drospirenone (SLYND) 4 MG TABS, Take 1 tablet by mouth daily before breakfast., Disp: 28 tablet, Rfl: 11   famotidine (PEPCID) 40 MG tablet, Take 1 tablet (40 mg total) by mouth 2 (two) times daily., Disp: 60 tablet, Rfl: 2   ferrous sulfate 325 (65 FE) MG tablet, Take 1 tablet (325 mg total) by mouth every other day., Disp: 30 tablet, Rfl: 3   fluticasone (FLONASE) 50 MCG/ACT nasal spray, Place 2 sprays into both nostrils daily., Disp: 16 g, Rfl: 1   loratadine (CLARITIN) 10 MG tablet, Take 1 tablet (10 mg total) by mouth daily., Disp: 30 tablet, Rfl: 5   NIFEdipine (PROCARDIA-XL/NIFEDICAL-XL) 30 MG 24 hr tablet, Take 1 tablet (30 mg total) by mouth daily. Can increase to twice a day as needed for symptomatic contractions, Disp: 30 tablet, Rfl: 11   triamterene-hydrochlorothiazide (DYAZIDE) 37.5-25 MG capsule, Take 1 each (1 capsule total) by mouth daily., Disp: 30  capsule, Rfl: 11   Medications ordered in this encounter:  No orders of the defined types were placed in this encounter.    *If you need refills on other medications prior to your next appointment, please contact your pharmacy*  Follow-Up: Call back or seek an in-person evaluation if the symptoms worsen or if the condition fails to improve as anticipated.  [place] [phone]  Other Instructions Force fluids Motrin or tylenol OTC OTC decongestant Throat lozenges if help New toothbrush in 3 days    If you have been instructed to have an in-person evaluation today at a local Urgent Care facility, please use the link below. It will take you to a list of all of our available [facility], including address, phone number and hours of operation. Please do not delay care.  [facility]  If you or a family member do not have a primary care provider, use the link below to schedule a visit and establish care. When you choose a [facility] primary care physician or advanced practice provider, you gain a long-term partner in health. Find a Primary Care Provider  Learn more about [facility]'s in-office and virtual care options: [facility]

## 2022-08-22 NOTE — Progress Notes (Signed)
Virtual Visit Consent   [name], you are scheduled for a virtual visit with Mary-Margaret Hassell Done, [place], a [facility] provider, today.     Just as with appointments in the office, your consent must be obtained to participate.  Your consent will be active for this visit and any virtual visit you may have with one of our providers in the next 365 days.     If you have a MyChart account, a copy of this consent can be sent to you electronically.  All virtual visits are billed to your insurance company just like a traditional visit in the office.    As this is a virtual visit, video technology does not allow for your provider to perform a traditional examination.  This may limit your provider's ability to fully assess your condition.  If your provider identifies any concerns that need to be evaluated in person or the need to arrange testing (such as labs, EKG, etc.), we will make arrangements to do so.     Although advances in technology are sophisticated, we cannot ensure that it will always work on either your end or our end.  If the connection with a video visit is poor, the visit may have to be switched to a telephone visit.  With either a video or telephone visit, we are not always able to ensure that we have a secure connection.     I need to obtain your verbal consent now.   Are you willing to proceed with your visit today? YES   [name] has provided verbal consent on 08/22/2022 for a virtual visit (video or telephone).   Mary-Margaret Hassell Done, FNP   Date: 08/22/2022 8:59 AM   Virtual Visit via Video Note   I, Mary-Margaret Hassell Done, connected with Audrey Butler ([id number], 1995/11/12) on 08/22/22 at  9:00 AM EST by a video-enabled telemedicine application and verified that I am speaking with the correct person using two identifiers.  Location: Patient: Virtual Visit Location Patient: Home Provider: Virtual Visit Location Provider: Mobile   I discussed the  limitations of evaluation and management by telemedicine and the availability of in person appointments. The patient expressed understanding and agreed to proceed.    History of Present Illness: Audrey Butler is a 27 y.o. who identifies as a female who was assigned female at birth, and is being seen today for sore throat .  HPI: Sore Throat  This is a new problem. The current episode started in the past 7 days. The problem has been waxing and waning. Neither side of throat is experiencing more pain than the other. There has been no fever. The pain is at a severity of 3/10. Associated symptoms include shortness of breath and trouble swallowing. She has had no exposure to strep. She has tried acetaminophen for the symptoms. The treatment provided mild relief.    Review of Systems  HENT:  Positive for trouble swallowing.   Respiratory:  Positive for shortness of breath.     Problems:  Patient Active Problem List   Diagnosis Date Noted   Acute non-recurrent pansinusitis 12/19/2021   Left cervical lymphadenopathy 12/19/2021   Post-nasal drainage 12/03/2021   Nausea and vomiting 12/03/2021   Gastroesophageal reflux disease 10/15/2021   Bilateral thoracic back pain 10/15/2021   HTN (hypertension), benign 10/15/2021   Intermittent palpitations 10/15/2021   Elevated blood pressure reading without diagnosis of hypertension 11/14/2020   Anemia during pregnancy in third trimester 04/08/2017    Allergies: No Known Allergies Medications:  Current Outpatient Medications:    amoxicillin-clavulanate (AUGMENTIN) 875-125 MG tablet, Take 1 tablet by mouth 2 (two) times daily., Disp: 20 tablet, Rfl: 0   Blood Pressure Monitoring (BLOOD PRESSURE KIT) DEVI, 1 kit by Does not apply route once a week. Check Blood Pressure regularly and record readings into the Babyscripts App.  Large Cuff.  DX O90.0, Disp: 1 each, Rfl: 0   Drospirenone (SLYND) 4 MG TABS, Take 1 tablet by mouth daily before breakfast.,  Disp: 28 tablet, Rfl: 11   famotidine (PEPCID) 40 MG tablet, Take 1 tablet (40 mg total) by mouth 2 (two) times daily., Disp: 60 tablet, Rfl: 2   ferrous sulfate 325 (65 FE) MG tablet, Take 1 tablet (325 mg total) by mouth every other day., Disp: 30 tablet, Rfl: 3   fluticasone (FLONASE) 50 MCG/ACT nasal spray, Place 2 sprays into both nostrils daily., Disp: 16 g, Rfl: 1   loratadine (CLARITIN) 10 MG tablet, Take 1 tablet (10 mg total) by mouth daily., Disp: 30 tablet, Rfl: 5   NIFEdipine (PROCARDIA-XL/NIFEDICAL-XL) 30 MG 24 hr tablet, Take 1 tablet (30 mg total) by mouth daily. Can increase to twice a day as needed for symptomatic contractions, Disp: 30 tablet, Rfl: 11   triamterene-hydrochlorothiazide (DYAZIDE) 37.5-25 MG capsule, Take 1 each (1 capsule total) by mouth daily., Disp: 30 capsule, Rfl: 11  Observations/Objective: Patient is well-developed, well-nourished in no acute distress.  Resting comfortably  at home.  Head is normocephalic, atraumatic.  No labored breathing.  Speech is clear and coherent with logical content.  Patient is alert and oriented at baseline.  Neck supple Post  oral pharynx no erythema  Assessment and Plan:  Audrey Butler in today with chief complaint of No chief complaint on file.   1. Viral pharyngitis Force fluids Motrin or tylenol OTC OTC decongestant Throat lozenges if help New toothbrush in 3 days    Follow Up Instructions: I discussed the assessment and treatment plan with the patient. The patient was provided an opportunity to ask questions and all were answered. The patient agreed with the plan and demonstrated an understanding of the instructions.  A copy of instructions were sent to the patient via MyChart.  The patient was advised to call back or seek an in-person evaluation if the symptoms worsen or if the condition fails to improve as anticipated.  Time:  I spent 5 minutes with the patient via telehealth technology discussing the  above problems/concerns.    Mary-Margaret Hassell Done, FNP

## 2022-08-24 ENCOUNTER — Telehealth (INDEPENDENT_AMBULATORY_CARE_PROVIDER_SITE_OTHER): Payer: Medicaid Other | Admitting: Family Medicine

## 2022-08-24 ENCOUNTER — Encounter: Payer: Self-pay | Admitting: Family Medicine

## 2022-08-24 ENCOUNTER — Encounter: Payer: Self-pay | Admitting: Obstetrics

## 2022-08-24 DIAGNOSIS — B9689 Other specified bacterial agents as the cause of diseases classified elsewhere: Secondary | ICD-10-CM | POA: Diagnosis not present

## 2022-08-24 DIAGNOSIS — J988 Other specified respiratory disorders: Secondary | ICD-10-CM | POA: Diagnosis not present

## 2022-08-24 MED ORDER — FLUTICASONE PROPIONATE 50 MCG/ACT NA SUSP: 2.0000 | Freq: Every day | NASAL | 0 refills | Status: DC

## 2022-08-24 MED ORDER — AZITHROMYCIN 250 MG PO TABS: ORAL_TABLET | ORAL | 0 refills | Status: AC

## 2022-08-24 NOTE — Patient Instructions (Signed)
Throat lozenges, chloraseptic spray, warm salt water gargles, hot tea/honey, cough syrup (Delsym), Tylenol/Ibuprofen, Vicks, and a humidifier at night. Continue Flonase & Claritin.

## 2022-08-24 NOTE — Progress Notes (Signed)
Virtual Visit via Video Note  I connected with Audrey Butler on 08/24/22 at 10:00 AM EDT by a video enabled telemedicine application and verified that I am speaking with the correct person using two identifiers.  Patient Location: Home Provider Location: Office/Clinic  I discussed the limitations, risks, security, and privacy concerns of performing an evaluation and management service by video and the availability of in person appointments. I also discussed with the patient that there may be a patient responsible charge related to this service. The patient expressed understanding and agreed to proceed.  Subjective: PCP: Audrey Rm, NP-C  Chief Complaint  Patient presents with   Sore Throat    Patient complains of cough, sore throat, and postnasal drainage. She is coughing up yellow/green.  She denies head/chest congestion, runny nose, sneezing, ear pain/pressure, facial pain/pressure, fever, shortness of breath, and wheezing. Onset of symptoms was 2 week ago, unchanged since that time. She is drinking plenty of fluids. Evaluation to date: none. Treatment to date: antihistamines, nasal steroids, and mucus thinner .  She does not smoke.    ROS: Per HPI  Current Outpatient Medications:    amoxicillin-clavulanate (AUGMENTIN) 875-125 MG tablet, Take 1 tablet by mouth 2 (two) times daily., Disp: 20 tablet, Rfl: 0   Blood Pressure Monitoring (BLOOD PRESSURE KIT) DEVI, 1 kit by Does not apply route once a week. Check Blood Pressure regularly and record readings into the Babyscripts App.  Large Cuff.  DX O90.0, Disp: 1 each, Rfl: 0   Drospirenone (SLYND) 4 MG TABS, Take 1 tablet by mouth daily before breakfast., Disp: 28 tablet, Rfl: 11   famotidine (PEPCID) 40 MG tablet, Take 1 tablet (40 mg total) by mouth 2 (two) times daily., Disp: 60 tablet, Rfl: 2   ferrous sulfate 325 (65 FE) MG tablet, Take 1 tablet (325 mg total) by mouth every other day., Disp: 30 tablet, Rfl: 3    fluticasone (FLONASE) 50 MCG/ACT nasal spray, Place 2 sprays into both nostrils daily., Disp: 16 g, Rfl: 1   loratadine (CLARITIN) 10 MG tablet, Take 1 tablet (10 mg total) by mouth daily., Disp: 30 tablet, Rfl: 5   NIFEdipine (PROCARDIA-XL/NIFEDICAL-XL) 30 MG 24 hr tablet, Take 1 tablet (30 mg total) by mouth daily. Can increase to twice a day as needed for symptomatic contractions, Disp: 30 tablet, Rfl: 11   triamterene-hydrochlorothiazide (DYAZIDE) 37.5-25 MG capsule, Take 1 each (1 capsule total) by mouth daily., Disp: 30 capsule, Rfl: 11  Observations/Objective: There were no vitals filed for this visit. Physical Exam Constitutional:      General: She is not in acute distress.    Appearance: Normal appearance. She is not ill-appearing or toxic-appearing.  Eyes:     General: No scleral icterus.       Right eye: No discharge.        Left eye: No discharge.     Conjunctiva/sclera: Conjunctivae normal.  Pulmonary:     Effort: Pulmonary effort is normal. No respiratory distress.  Neurological:     Mental Status: She is alert and oriented to person, place, and time.  Psychiatric:        Mood and Affect: Mood normal.        Behavior: Behavior normal.        Thought Content: Thought content normal.        Judgment: Judgment normal.     Assessment and Plan: 1. Bacterial respiratory infection Education provided on respiratory infections.  Treating with azithromycin due to duration  of symptoms without improvement.  Discussed symptom management including throat lozenges, chloraseptic spray, warm salt water gargles, hot tea/honey, cough syrup (Delsym), Tylenol/Ibuprofen, Vicks, and a humidifier at night.  Continue Flonase and Claritin. - azithromycin (ZITHROMAX) 250 MG tablet; Take 2 tablets (500 mg total) by mouth daily for 1 day, THEN 1 tablet (250 mg total) daily for 4 days.  Dispense: 6 each; Refill: 0 - fluticasone (FLONASE) 50 MCG/ACT nasal spray; Place 2 sprays into both nostrils  daily.  Dispense: 16 g; Refill: 0   Follow Up Instructions: Return if symptoms worsen or fail to improve.   I discussed the assessment and treatment plan with the patient. The patient was provided an opportunity to ask questions, and all were answered. The patient agreed with the plan and demonstrated an understanding of the instructions.   The patient was advised to call back or seek an in-person evaluation if the symptoms worsen or if the condition fails to improve as anticipated.  The above assessment and management plan was discussed with the patient. The patient verbalized understanding of and has agreed to the management plan.   Audrey Brooklyn, FNP

## 2022-08-25 ENCOUNTER — Encounter: Payer: Self-pay | Admitting: Obstetrics

## 2022-08-25 ENCOUNTER — Other Ambulatory Visit: Payer: Self-pay | Admitting: Obstetrics

## 2022-08-25 DIAGNOSIS — Z30011 Encounter for initial prescription of contraceptive pills: Secondary | ICD-10-CM

## 2022-08-25 MED ORDER — SLYND 4 MG PO TABS: 1.0000 | ORAL_TABLET | Freq: Every day | ORAL | 11 refills | Status: DC

## 2022-08-31 ENCOUNTER — Other Ambulatory Visit: Payer: Self-pay | Admitting: Obstetrics

## 2022-08-31 DIAGNOSIS — Z30011 Encounter for initial prescription of contraceptive pills: Secondary | ICD-10-CM

## 2022-09-02 ENCOUNTER — Telehealth: Payer: Self-pay | Admitting: Obstetrics

## 2022-09-02 NOTE — Telephone Encounter (Signed)
PT called inquiring BC, PA status. Informed PT we can provide a sample that will last until PA completed. PT stated she is sending her boyfriend to pickup the sample.

## 2022-10-06 ENCOUNTER — Other Ambulatory Visit: Payer: Self-pay | Admitting: Obstetrics

## 2022-10-06 DIAGNOSIS — Z30011 Encounter for initial prescription of contraceptive pills: Secondary | ICD-10-CM

## 2022-10-29 ENCOUNTER — Telehealth: Payer: Medicaid Other | Admitting: Physician Assistant

## 2022-10-29 DIAGNOSIS — T3695XA Adverse effect of unspecified systemic antibiotic, initial encounter: Secondary | ICD-10-CM | POA: Diagnosis not present

## 2022-10-29 DIAGNOSIS — N760 Acute vaginitis: Secondary | ICD-10-CM | POA: Diagnosis not present

## 2022-10-29 DIAGNOSIS — B379 Candidiasis, unspecified: Secondary | ICD-10-CM

## 2022-10-29 DIAGNOSIS — B9689 Other specified bacterial agents as the cause of diseases classified elsewhere: Secondary | ICD-10-CM

## 2022-10-29 MED ORDER — FLUCONAZOLE 150 MG PO TABS: 150.0000 mg | ORAL_TABLET | ORAL | 0 refills | Status: DC | PRN

## 2022-10-29 MED ORDER — METRONIDAZOLE 500 MG PO TABS: 500.0000 mg | ORAL_TABLET | Freq: Two times a day (BID) | ORAL | 0 refills | Status: AC

## 2022-10-29 NOTE — Progress Notes (Signed)
Virtual Visit Consent   Audrey Butler, you are scheduled for a virtual visit with a [facility] provider today. Just as with appointments in the office, your consent must be obtained to participate. Your consent will be active for this visit and any virtual visit you may have with one of our providers in the next 365 days. If you have a MyChart account, a copy of this consent can be sent to you electronically.  As this is a virtual visit, video technology does not allow for your provider to perform a traditional examination. This may limit your provider's ability to fully assess your condition. If your provider identifies any concerns that need to be evaluated in person or the need to arrange testing (such as labs, EKG, etc.), we will make arrangements to do so. Although advances in technology are sophisticated, we cannot ensure that it will always work on either your end or our end. If the connection with a video visit is poor, the visit may have to be switched to a telephone visit. With either a video or telephone visit, we are not always able to ensure that we have a secure connection.  By engaging in this virtual visit, you consent to the provision of healthcare and authorize for your insurance to be billed (if applicable) for the services provided during this visit. Depending on your insurance coverage, you may receive a charge related to this service.  I need to obtain your verbal consent now. Are you willing to proceed with your visit today? Audrey Butler has provided verbal consent on 10/29/2022 for a virtual visit (video or telephone). Audrey Loveless, PA-C  Date: 10/29/2022 10:20 AM  Virtual Visit via Video Note   I, Audrey Butler, connected with  Audrey Butler  ([id number], 26/05/1996) on 10/29/22 at 10:15 AM EDT by a video-enabled telemedicine application and verified that I am speaking with the correct person using two identifiers.  Location: Patient: Virtual Visit  Location Patient: Home Provider: Virtual Visit Location Provider: Home Office   I discussed the limitations of evaluation and management by telemedicine and the availability of in person appointments. The patient expressed understanding and agreed to proceed.    History of Present Illness: Audrey Butler is a 27 y.o. who identifies as a female who was assigned female at birth, and is being seen today for vaginal discharge.  HPI: Vaginal Discharge The patient's primary symptoms include a genital odor and vaginal discharge. The patient's pertinent negatives include no genital itching, genital lesions, genital rash or pelvic pain. This is a recurrent problem. The current episode started 1 to 4 weeks ago (2-3 weeks ago). The problem occurs intermittently. The problem has been gradually worsening. The patient is experiencing no pain. Pertinent negatives include no back pain, chills, constipation, discolored urine, dysuria, fever, flank pain, frequency, headaches, hematuria, nausea or painful intercourse. The vaginal discharge was white, thin, malodorous and yellow. Nothing aggravates the symptoms. She has tried nothing for the symptoms. The treatment provided no relief.     Problems:  Patient Active Problem List   Diagnosis Date Noted   Acute non-recurrent pansinusitis 12/19/2021   Left cervical lymphadenopathy 12/19/2021   Post-nasal drainage 12/03/2021   Nausea and vomiting 12/03/2021   Gastroesophageal reflux disease 10/15/2021   Bilateral thoracic back pain 10/15/2021   HTN (hypertension), benign 10/15/2021   Intermittent palpitations 10/15/2021   Elevated blood pressure reading without diagnosis of hypertension 11/14/2020   Anemia during pregnancy in third trimester 04/08/2017    Allergies:  No Known Allergies Medications:  Current Outpatient Medications:    fluconazole (DIFLUCAN) 150 MG tablet, Take 1 tablet (150 mg total) by mouth every 3 (three) days as needed., Disp: 2 tablet, Rfl:  0   metroNIDAZOLE (FLAGYL) 500 MG tablet, Take 1 tablet (500 mg total) by mouth 2 (two) times daily for 7 days., Disp: 14 tablet, Rfl: 0   Blood Pressure Monitoring (BLOOD PRESSURE KIT) DEVI, 1 kit by Does not apply route once a week. Check Blood Pressure regularly and record readings into the Babyscripts App.  Large Cuff.  DX O90.0, Disp: 1 each, Rfl: 0   famotidine (PEPCID) 40 MG tablet, Take 1 tablet (40 mg total) by mouth 2 (two) times daily., Disp: 60 tablet, Rfl: 2   ferrous sulfate 325 (65 FE) MG tablet, Take 1 tablet (325 mg total) by mouth every other day., Disp: 30 tablet, Rfl: 3   fluticasone (FLONASE) 50 MCG/ACT nasal spray, Place 2 sprays into both nostrils daily., Disp: 16 g, Rfl: 0   loratadine (CLARITIN) 10 MG tablet, Take 1 tablet (10 mg total) by mouth daily., Disp: 30 tablet, Rfl: 5   NIFEdipine (PROCARDIA-XL/NIFEDICAL-XL) 30 MG 24 hr tablet, Take 1 tablet (30 mg total) by mouth daily. Can increase to twice a day as needed for symptomatic contractions, Disp: 30 tablet, Rfl: 11   SLYND 4 MG TABS, TAKE 1 TABLET BY MOUTH EVERY DAY BEFORE BREAKFAST, Disp: 28 tablet, Rfl: 11   triamterene-hydrochlorothiazide (DYAZIDE) 37.5-25 MG capsule, Take 1 each (1 capsule total) by mouth daily., Disp: 30 capsule, Rfl: 11  Observations/Objective: Patient is well-developed, well-nourished in no acute distress.  Resting comfortably at home.  Head is normocephalic, atraumatic.  No labored breathing.  Speech is clear and coherent with logical content.  Patient is alert and oriented at baseline.    Assessment and Plan: 1. BV (bacterial vaginosis) - metroNIDAZOLE (FLAGYL) 500 MG tablet; Take 1 tablet (500 mg total) by mouth 2 (two) times daily for 7 days.  Dispense: 14 tablet; Refill: 0  2. Antibiotic-induced yeast infection - fluconazole (DIFLUCAN) 150 MG tablet; Take 1 tablet (150 mg total) by mouth every 3 (three) days as needed.  Dispense: 2 tablet; Refill: 0  - Symptoms consistent with  BV - Metronidazole prescribed - Limit bubble baths, scented lotions/soaps/detergents - Limit tight fitting clothing - Diflucan given as prophylaxis as patient tends to get vaginal yeast infections with antibiotic use - Seek on person evaluation if not improving or if symptoms worsen   Follow Up Instructions: I discussed the assessment and treatment plan with the patient. The patient was provided an opportunity to ask questions and all were answered. The patient agreed with the plan and demonstrated an understanding of the instructions.  A copy of instructions were sent to the patient via MyChart unless otherwise noted below.     The patient was advised to call back or seek an in-person evaluation if the symptoms worsen or if the condition fails to improve as anticipated.  Time:  I spent 8 minutes with the patient via telehealth technology discussing the above problems/concerns.    Audrey Loveless, PA-C

## 2022-10-29 NOTE — Patient Instructions (Signed)
Maricela Bo, thank you for joining [name], PA-C for today's virtual visit.  While this provider is not your primary care provider (PCP), if your PCP is located in our provider database this encounter information will be shared with them immediately following your visit.   A [facility] MyChart account gives you access to today's visit and all your visits, tests, and labs performed at [facility] " click here if you don't have a [facility] MyChart account or go to [email or web address]  Consent: (Patient) Audrey Butler provided verbal consent for this virtual visit at the beginning of the encounter.  Current Medications:  Current Outpatient Medications:    fluconazole (DIFLUCAN) 150 MG tablet, Take 1 tablet (150 mg total) by mouth every 3 (three) days as needed., Disp: 2 tablet, Rfl: 0   metroNIDAZOLE (FLAGYL) 500 MG tablet, Take 1 tablet (500 mg total) by mouth 2 (two) times daily for 7 days., Disp: 14 tablet, Rfl: 0   Blood Pressure Monitoring (BLOOD PRESSURE KIT) DEVI, 1 kit by Does not apply route once a week. Check Blood Pressure regularly and record readings into the Babyscripts App.  Large Cuff.  DX O90.0, Disp: 1 each, Rfl: 0   famotidine (PEPCID) 40 MG tablet, Take 1 tablet (40 mg total) by mouth 2 (two) times daily., Disp: 60 tablet, Rfl: 2   ferrous sulfate 325 (65 FE) MG tablet, Take 1 tablet (325 mg total) by mouth every other day., Disp: 30 tablet, Rfl: 3   fluticasone (FLONASE) 50 MCG/ACT nasal spray, Place 2 sprays into both nostrils daily., Disp: 16 g, Rfl: 0   loratadine (CLARITIN) 10 MG tablet, Take 1 tablet (10 mg total) by mouth daily., Disp: 30 tablet, Rfl: 5   NIFEdipine (PROCARDIA-XL/NIFEDICAL-XL) 30 MG 24 hr tablet, Take 1 tablet (30 mg total) by mouth daily. Can increase to twice a day as needed for symptomatic contractions, Disp: 30 tablet, Rfl: 11   SLYND 4 MG TABS, TAKE 1 TABLET BY MOUTH EVERY DAY BEFORE BREAKFAST, Disp: 28  tablet, Rfl: 11   triamterene-hydrochlorothiazide (DYAZIDE) 37.5-25 MG capsule, Take 1 each (1 capsule total) by mouth daily., Disp: 30 capsule, Rfl: 11   Medications ordered in this encounter:  Meds ordered this encounter  Medications   metroNIDAZOLE (FLAGYL) 500 MG tablet    Sig: Take 1 tablet (500 mg total) by mouth 2 (two) times daily for 7 days.    Dispense:  14 tablet    Refill:  0    Order Specific Question:   Supervising Provider    Answer:   Merrilee Jansky [[id number]]   fluconazole (DIFLUCAN) 150 MG tablet    Sig: Take 1 tablet (150 mg total) by mouth every 3 (three) days as needed.    Dispense:  2 tablet    Refill:  0    Order Specific Question:   Supervising Provider    Answer:   Merrilee Jansky [id number]     *If you need refills on other medications prior to your next appointment, please contact your pharmacy*  Follow-Up: Call back or seek an in-person evaluation if the symptoms worsen or if the condition fails to improve as anticipated.   Virtual Care [phone]  Other Instructions  Vaginal Probiotics: AZO vaginal probiotic OLLY Happy Hoo-Ha RAW Vaginal Care RenewLife Women's vaginal probiotic RepHresh Pro-B  Vaginal washes: Honey Pot Summer's Eve Vagisil Feminine cleanser    If you have been instructed to have an in-person evaluation today at a local  Urgent Care facility, please use the link below. It will take you to a list of all of our available [facility], including address, phone number and hours of operation. Please do not delay care.  [facility]  If you or a family member do not have a primary care provider, use the link below to schedule a visit and establish care. When you choose a [facility] primary care physician or advanced practice provider, you gain a long-term partner in health. Find a Primary Care Provider  Learn more about [facility]'s in-office and virtual care options: [facility] - Get  Care Now

## 2022-11-22 ENCOUNTER — Telehealth: Payer: Medicaid Other | Admitting: Nurse Practitioner

## 2022-11-22 DIAGNOSIS — H7493 Unspecified disorder of middle ear and mastoid, bilateral: Secondary | ICD-10-CM

## 2022-11-22 MED ORDER — FLUTICASONE PROPIONATE 50 MCG/ACT NA SUSP: 2.0000 | Freq: Every day | NASAL | 0 refills | Status: DC

## 2022-11-22 MED ORDER — MECLIZINE HCL 12.5 MG PO TABS: 12.5000 mg | ORAL_TABLET | Freq: Three times a day (TID) | ORAL | 0 refills | Status: DC | PRN

## 2022-11-22 MED ORDER — LORATADINE 10 MG PO TABS: 10.0000 mg | ORAL_TABLET | Freq: Every day | ORAL | 0 refills | Status: DC

## 2022-11-22 NOTE — Progress Notes (Signed)
Virtual Visit Consent   Audrey Butler, you are scheduled for a virtual visit with a [facility] provider today. Just as with appointments in the office, your consent must be obtained to participate. Your consent will be active for this visit and any virtual visit you may have with one of our providers in the next 365 days. If you have a MyChart account, a copy of this consent can be sent to you electronically.  As this is a virtual visit, video technology does not allow for your provider to perform a traditional examination. This may limit your provider's ability to fully assess your condition. If your provider identifies any concerns that need to be evaluated in person or the need to arrange testing (such as labs, EKG, etc.), we will make arrangements to do so. Although advances in technology are sophisticated, we cannot ensure that it will always work on either your end or our end. If the connection with a video visit is poor, the visit may have to be switched to a telephone visit. With either a video or telephone visit, we are not always able to ensure that we have a secure connection.  By engaging in this virtual visit, you consent to the provision of healthcare and authorize for your insurance to be billed (if applicable) for the services provided during this visit. Depending on your insurance coverage, you may receive a charge related to this service.  I need to obtain your verbal consent now. Are you willing to proceed with your visit today? Audrey Butler has provided verbal consent on 11/22/2022 for a virtual visit (video or telephone). Audrey Rigg, NP  Date: 11/22/2022 10:15 AM  Virtual Visit via Video Note   I, Audrey Butler, connected with  Audrey Butler  ([id number], 1995/09/07) on 11/22/22 at 10:00 AM EDT by a video-enabled telemedicine application and verified that I am speaking with the correct person using two identifiers.  Location: Patient: Virtual Visit Location  Patient: Home Provider: Virtual Visit Location Provider: Home Office   I discussed the limitations of evaluation and management by telemedicine and the availability of in person appointments. The patient expressed understanding and agreed to proceed.    History of Present Illness: Audrey Butler is a 27 y.o. who identifies as a female who was assigned female at birth, and is being seen today for dizziness and bilateral ear pressure and fullness.  [name] notes 4 days onset of bilateral ear pressure, vertigo and nausea. Denies fever, vomiting, headache or any URI symptoms. Feels like she has been underwater. She does have a history of vertigo and allergies. Has been taking dramamine with little relief of symptoms. Denies any pregnancy or possible pregnancy.    Problems:  Patient Active Problem List   Diagnosis Date Noted   Acute non-recurrent pansinusitis 12/19/2021   Left cervical lymphadenopathy 12/19/2021   Post-nasal drainage 12/03/2021   Nausea and vomiting 12/03/2021   Gastroesophageal reflux disease 10/15/2021   Bilateral thoracic back pain 10/15/2021   HTN (hypertension), benign 10/15/2021   Intermittent palpitations 10/15/2021   Elevated blood pressure reading without diagnosis of hypertension 11/14/2020   Anemia during pregnancy in third trimester 04/08/2017    Allergies: No Known Allergies Medications:  Current Outpatient Medications:    Blood Pressure Monitoring (BLOOD PRESSURE KIT) DEVI, 1 kit by Does not apply route once a week. Check Blood Pressure regularly and record readings into the Babyscripts App.  Large Cuff.  DX O90.0, Disp: 1 each, Rfl: 0   famotidine (  PEPCID) 40 MG tablet, Take 1 tablet (40 mg total) by mouth 2 (two) times daily., Disp: 60 tablet, Rfl: 2   ferrous sulfate 325 (65 FE) MG tablet, Take 1 tablet (325 mg total) by mouth every other day., Disp: 30 tablet, Rfl: 3   fluconazole (DIFLUCAN) 150 MG tablet, Take 1 tablet (150 mg total) by mouth  every 3 (three) days as needed., Disp: 2 tablet, Rfl: 0   fluticasone (FLONASE) 50 MCG/ACT nasal spray, Place 2 sprays into both nostrils daily., Disp: 16 g, Rfl: 0   loratadine (CLARITIN) 10 MG tablet, Take 1 tablet (10 mg total) by mouth daily., Disp: 30 tablet, Rfl: 0   meclizine (ANTIVERT) 12.5 MG tablet, Take 1-2 tablets (12.5-25 mg total) by mouth 3 (three) times daily as needed for dizziness., Disp: 30 tablet, Rfl: 0   NIFEdipine (PROCARDIA-XL/NIFEDICAL-XL) 30 MG 24 hr tablet, Take 1 tablet (30 mg total) by mouth daily. Can increase to twice a day as needed for symptomatic contractions, Disp: 30 tablet, Rfl: 11   SLYND 4 MG TABS, TAKE 1 TABLET BY MOUTH EVERY DAY BEFORE BREAKFAST, Disp: 28 tablet, Rfl: 11   triamterene-hydrochlorothiazide (DYAZIDE) 37.5-25 MG capsule, Take 1 each (1 capsule total) by mouth daily., Disp: 30 capsule, Rfl: 11  Observations/Objective: Patient is well-developed, well-nourished in no acute distress.  Resting comfortably at home.  Head is normocephalic, atraumatic.  No labored breathing.  Speech is clear and coherent with logical content.  Patient is alert and oriented at baseline.    Assessment and Plan: 1. Middle ear disorder, bilateral - meclizine (ANTIVERT) 12.5 MG tablet; Take 1-2 tablets (12.5-25 mg total) by mouth 3 (three) times daily as needed for dizziness.  Dispense: 30 tablet; Refill: 0 - loratadine (CLARITIN) 10 MG tablet; Take 1 tablet (10 mg total) by mouth daily.  Dispense: 30 tablet; Refill: 0 - fluticasone (FLONASE) 50 MCG/ACT nasal spray; Place 2 sprays into both nostrils daily.  Dispense: 16 g; Refill: 0    Follow Up Instructions: I discussed the assessment and treatment plan with the patient. The patient was provided an opportunity to ask questions and all were answered. The patient agreed with the plan and demonstrated an understanding of the instructions.  A copy of instructions were sent to the patient via MyChart unless otherwise  noted below.   The patient was advised to call back or seek an in-person evaluation if the symptoms worsen or if the condition fails to improve as anticipated.  Time:  I spent 11 minutes with the patient via telehealth technology discussing the above problems/concerns.    Audrey Rigg, NP

## 2022-11-22 NOTE — Patient Instructions (Addendum)
Audrey Butler, thank you for joining Claiborne Rigg, NP for today's virtual visit.  While this provider is not your primary care provider (PCP), if your PCP is located in our provider database this encounter information will be shared with them immediately following your visit.   A [facility] MyChart account gives you access to today's visit and all your visits, tests, and labs performed at [facility] " click here if you don't have a [facility] MyChart account or go to [email or web address]  Consent: (Patient) Audrey Butler provided verbal consent for this virtual visit at the beginning of the encounter.  Current Medications:  Current Outpatient Medications:    Blood Pressure Monitoring (BLOOD PRESSURE KIT) DEVI, 1 kit by Does not apply route once a week. Check Blood Pressure regularly and record readings into the Babyscripts App.  Large Cuff.  DX O90.0, Disp: 1 each, Rfl: 0   famotidine (PEPCID) 40 MG tablet, Take 1 tablet (40 mg total) by mouth 2 (two) times daily., Disp: 60 tablet, Rfl: 2   ferrous sulfate 325 (65 FE) MG tablet, Take 1 tablet (325 mg total) by mouth every other day., Disp: 30 tablet, Rfl: 3   fluconazole (DIFLUCAN) 150 MG tablet, Take 1 tablet (150 mg total) by mouth every 3 (three) days as needed., Disp: 2 tablet, Rfl: 0   fluticasone (FLONASE) 50 MCG/ACT nasal spray, Place 2 sprays into both nostrils daily., Disp: 16 g, Rfl: 0   loratadine (CLARITIN) 10 MG tablet, Take 1 tablet (10 mg total) by mouth daily., Disp: 30 tablet, Rfl: 0   meclizine (ANTIVERT) 12.5 MG tablet, Take 1-2 tablets (12.5-25 mg total) by mouth 3 (three) times daily as needed for dizziness., Disp: 30 tablet, Rfl: 0   NIFEdipine (PROCARDIA-XL/NIFEDICAL-XL) 30 MG 24 hr tablet, Take 1 tablet (30 mg total) by mouth daily. Can increase to twice a day as needed for symptomatic contractions, Disp: 30 tablet, Rfl: 11   SLYND 4 MG TABS, TAKE 1 TABLET BY MOUTH EVERY DAY BEFORE BREAKFAST,  Disp: 28 tablet, Rfl: 11   triamterene-hydrochlorothiazide (DYAZIDE) 37.5-25 MG capsule, Take 1 each (1 capsule total) by mouth daily., Disp: 30 capsule, Rfl: 11   Medications ordered in this encounter:  Meds ordered this encounter  Medications   meclizine (ANTIVERT) 12.5 MG tablet    Sig: Take 1-2 tablets (12.5-25 mg total) by mouth 3 (three) times daily as needed for dizziness.    Dispense:  30 tablet    Refill:  0    Order Specific Question:   Supervising Provider    Answer:   Merrilee Jansky [[id number]]   loratadine (CLARITIN) 10 MG tablet    Sig: Take 1 tablet (10 mg total) by mouth daily.    Dispense:  30 tablet    Refill:  0    Order Specific Question:   Supervising Provider    Answer:   Merrilee Jansky [[id number]]   fluticasone (FLONASE) 50 MCG/ACT nasal spray    Sig: Place 2 sprays into both nostrils daily.    Dispense:  16 g    Refill:  0    Order Specific Question:   Supervising Provider    Answer:   Merrilee Jansky [id number]     *If you need refills on other medications prior to your next appointment, please contact your pharmacy*  Follow-Up: Call back or seek an in-person evaluation if the symptoms worsen or if the condition fails to improve as anticipated.  [facility] [phone])  [phone]  Other Instructions May use nasal saline irrigation as well as humidifier.    If you have been instructed to have an in-person evaluation today at a local Urgent Care facility, please use the link below. It will take you to a list of all of our available [facility], including address, phone number and hours of operation. Please do not delay care.  [facility]  If you or a family member do not have a primary care provider, use the link below to schedule a visit and establish care. When you choose a [facility] primary care physician or advanced practice provider, you gain a long-term partner in health. Find a Primary Care Provider  Learn  more about [facility]'s in-office and virtual care options: [facility] - Get Care Now

## 2022-12-30 ENCOUNTER — Other Ambulatory Visit: Payer: Self-pay | Admitting: Obstetrics

## 2022-12-30 DIAGNOSIS — Z30011 Encounter for initial prescription of contraceptive pills: Secondary | ICD-10-CM

## 2023-01-04 ENCOUNTER — Other Ambulatory Visit: Payer: Self-pay | Admitting: Family Medicine

## 2023-01-04 DIAGNOSIS — Z30011 Encounter for initial prescription of contraceptive pills: Secondary | ICD-10-CM

## 2023-01-04 MED ORDER — SLYND 4 MG PO TABS: 1.0000 | ORAL_TABLET | Freq: Every day | ORAL | 3 refills | Status: AC

## 2023-01-05 ENCOUNTER — Encounter: Payer: Self-pay | Admitting: Family Medicine

## 2023-02-11 ENCOUNTER — Encounter: Payer: Self-pay | Admitting: Obstetrics

## 2023-02-17 ENCOUNTER — Encounter: Payer: Self-pay | Admitting: Family Medicine

## 2023-02-17 DIAGNOSIS — I10 Essential (primary) hypertension: Secondary | ICD-10-CM

## 2023-02-18 MED ORDER — TRIAMTERENE-HCTZ 37.5-25 MG PO CAPS: 1.0000 | ORAL_CAPSULE | Freq: Every day | ORAL | 0 refills | Status: DC

## 2023-02-18 MED ORDER — NIFEDIPINE ER OSMOTIC RELEASE 30 MG PO TB24: 30.0000 mg | ORAL_TABLET | Freq: Every day | ORAL | 0 refills | Status: DC

## 2023-02-18 NOTE — Addendum Note (Signed)
Addended by: Marinus Maw on: 02/18/2023 10:51 AM   Modules accepted: Orders

## 2023-02-18 NOTE — Telephone Encounter (Signed)
Nifedipine is 1st fill with you.. ok to refill?

## 2023-03-12 ENCOUNTER — Ambulatory Visit: Payer: Medicaid Other | Admitting: Family Medicine

## 2023-03-17 ENCOUNTER — Ambulatory Visit (INDEPENDENT_AMBULATORY_CARE_PROVIDER_SITE_OTHER): Payer: Medicaid Other | Admitting: Family Medicine

## 2023-03-17 ENCOUNTER — Other Ambulatory Visit: Payer: Self-pay | Admitting: Family Medicine

## 2023-03-17 ENCOUNTER — Encounter: Payer: Self-pay | Admitting: Family Medicine

## 2023-03-17 ENCOUNTER — Other Ambulatory Visit (INDEPENDENT_AMBULATORY_CARE_PROVIDER_SITE_OTHER): Payer: Medicaid Other

## 2023-03-17 VITALS — BP 124/84 | HR 102 | Temp 97.6°F | Ht 71.0 in | Wt 237.0 lb

## 2023-03-17 DIAGNOSIS — Z6833 Body mass index (BMI) 33.0-33.9, adult: Secondary | ICD-10-CM

## 2023-03-17 DIAGNOSIS — I10 Essential (primary) hypertension: Secondary | ICD-10-CM

## 2023-03-17 DIAGNOSIS — R0982 Postnasal drip: Secondary | ICD-10-CM

## 2023-03-17 DIAGNOSIS — J309 Allergic rhinitis, unspecified: Secondary | ICD-10-CM | POA: Diagnosis not present

## 2023-03-17 LAB — CBC WITH DIFFERENTIAL/PLATELET
Basophils Absolute: 0 10*3/uL (ref 0.0–0.1)
Basophils Relative: 0.7 % (ref 0.0–3.0)
Eosinophils Absolute: 0.3 10*3/uL (ref 0.0–0.7)
Eosinophils Relative: 4.6 % (ref 0.0–5.0)
HCT: 39 % (ref 36.0–46.0)
Hemoglobin: 12.3 g/dL (ref 12.0–15.0)
Lymphocytes Relative: 35.2 % (ref 12.0–46.0)
Lymphs Abs: 2.1 10*3/uL (ref 0.7–4.0)
MCHC: 31.6 g/dL (ref 30.0–36.0)
MCV: 82.3 fL (ref 78.0–100.0)
Monocytes Absolute: 0.3 10*3/uL (ref 0.1–1.0)
Monocytes Relative: 5.7 % (ref 3.0–12.0)
Neutro Abs: 3.2 10*3/uL (ref 1.4–7.7)
Neutrophils Relative %: 53.8 % (ref 43.0–77.0)
Platelets: 262 10*3/uL (ref 150.0–400.0)
RBC: 4.74 Mil/uL (ref 3.87–5.11)
RDW: 14.6 % (ref 11.5–15.5)
WBC: 5.9 10*3/uL (ref 4.0–10.5)

## 2023-03-17 MED ORDER — LEVOCETIRIZINE DIHYDROCHLORIDE 5 MG PO TABS: 5.0000 mg | ORAL_TABLET | Freq: Every evening | ORAL | 2 refills | Status: AC

## 2023-03-17 NOTE — Progress Notes (Signed)
Subjective:     Patient ID: Audrey Butler, female    DOB: Feb 28, 1996, 27 y.o.   MRN: [id number]  Chief Complaint  Patient presents with   Hypertension    HPI  Discussed the use of AI scribe software for clinical note transcription with the patient, who gave verbal consent to proceed.  History of Present Illness          Here to follow up on HTN. Taking medications without issues.   C/o post nasal drainage and congestion.       Health Maintenance Due  Topic Date Due   HPV VACCINES (1 - 3-dose series) Never done    Past Medical History:  Diagnosis Date   Abnormal antenatal test 11/18/2020   High risk T21, declined amnio   Pregnancy induced hypertension    Prior pregnancy complicated by PIH, antepartum, third trimester 07/05/2020   Elev BP x1 in MAU 11/14/20   Supervision of high-risk pregnancy 07/02/2020    Nursing Staff Provider  Office Location  [facility] Dating  LMP  Language  ENGLISH Anatomy US  08-30-20  Flu Vaccine  Declined 07/05/2020 Genetic Screen  NIPS:   AFP:   First Screen:  Quad:    TDaP Vaccine   Declined 11/01/20 Hgb A1C or  GTT Early  Third trimester   COVID Vaccine  NO   LAB RESULTS   Rhogam   Blood Type O/Positive/-- (01/21 1041)   Feeding Plan BOTTLE Antibody Negative (01/21 1041)  Cont   Twin gestation in second trimester 07/05/2020    Past Surgical History:  Procedure Laterality Date   CESAREAN SECTION  12/14/2020   NO PAST SURGERIES      Family History  Problem Relation Age of Onset   High blood pressure Mother    High blood pressure Father    Diabetes Maternal Grandfather    Obesity Neg Hx    Hypertension Neg Hx    Heart disease Neg Hx    Cancer Neg Hx     Social History   Socioeconomic History   Marital status: Single    Spouse name: Not on file   Number of children: 1   Years of education: Not on file   Highest education level: Not on file  Occupational History   Occupation: [employer]  Tobacco Use   Smoking status: Never   Smokeless  tobacco: Never  Vaping Use   Vaping status: Never Used  Substance and Sexual Activity   Alcohol use: No   Drug use: No   Sexual activity: Yes    Partners: Male    Birth control/protection: None  Other Topics Concern   Not on file  Social History Narrative   Not on file   Social Determinants of Health   Financial Resource Strain: Not on file  Food Insecurity: Not on file  Transportation Needs: Not on file  Physical Activity: Not on file  Stress: Not on file  Social Connections: Not on file  Intimate Partner Violence: Not on file    Outpatient Medications Prior to Visit  Medication Sig Dispense Refill   Blood Pressure Monitoring (BLOOD PRESSURE KIT) DEVI 1 kit by Does not apply route once a week. Check Blood Pressure regularly and record readings into the [employer] App.  Large Cuff.  DX O90.0 1 each 0   ferrous sulfate 325 (65 FE) MG tablet Take 1 tablet (325 mg total) by mouth every other day. 30 tablet 3   fluconazole (DIFLUCAN) 150 MG tablet Take 1  tablet (150 mg total) by mouth every 3 (three) days as needed. 2 tablet 0   fluticasone (FLONASE) 50 MCG/ACT nasal spray Place 2 sprays into both nostrils daily. 16 g 0   meclizine (ANTIVERT) 12.5 MG tablet Take 1-2 tablets (12.5-25 mg total) by mouth 3 (three) times daily as needed for dizziness. 30 tablet 0   loratadine (CLARITIN) 10 MG tablet Take 1 tablet (10 mg total) by mouth daily. 30 tablet 0   NIFEdipine (PROCARDIA-XL/NIFEDICAL-XL) 30 MG 24 hr tablet Take 1 tablet (30 mg total) by mouth daily. 30 tablet 0   triamterene-hydrochlorothiazide (DYAZIDE) 37.5-25 MG capsule Take 1 each (1 capsule total) by mouth daily. 30 capsule 0   Drospirenone (SLYND) 4 MG TABS Take 1 tablet (4 mg total) by mouth daily. (Patient not taking: Reported on 03/17/2023) 84 tablet 3   famotidine (PEPCID) 40 MG tablet Take 1 tablet (40 mg total) by mouth 2 (two) times daily. 60 tablet 2   No facility-administered medications prior to visit.    No  Known Allergies  Review of Systems  Constitutional:  Negative for chills and fever.  HENT:  Positive for congestion.   Respiratory:  Negative for cough and shortness of breath.   Cardiovascular:  Negative for chest pain and palpitations.  Gastrointestinal:  Negative for abdominal pain, nausea and vomiting.  Genitourinary:  Negative for dysuria, frequency and urgency.  Neurological:  Negative for dizziness and focal weakness.       Objective:    Physical Exam Constitutional:      General: She is not in acute distress.    Appearance: She is not ill-appearing.  Eyes:     Extraocular Movements: Extraocular movements intact.     Conjunctiva/sclera: Conjunctivae normal.  Cardiovascular:     Rate and Rhythm: Normal rate.  Pulmonary:     Effort: Pulmonary effort is normal.  Musculoskeletal:     Cervical back: Normal range of motion and neck supple.  Skin:    General: Skin is warm and dry.  Neurological:     General: No focal deficit present.     Mental Status: She is alert and oriented to person, place, and time.  Psychiatric:        Mood and Affect: Mood normal.        Behavior: Behavior normal.        Thought Content: Thought content normal.      BP 124/84   Pulse (!) 102   Temp 97.6 F (36.4 C) (Temporal)   Ht 5\' 11"  (1.803 m)   Wt 237 lb (107.5 kg)   SpO2 99%   BMI 33.05 kg/m  Wt Readings from Last 3 Encounters:  03/17/23 237 lb (107.5 kg)  12/19/21 222 lb (100.7 kg)  12/03/21 226 lb (102.5 kg)       Assessment & Plan:   Problem List Items Addressed This Visit       Cardiovascular and Mediastinum   HTN (hypertension), benign - Primary   Relevant Orders   CBC with Differential/Platelet   Basic metabolic panel   TSH     Other   Post-nasal drainage   Relevant Medications   levocetirizine (XYZAL) 5 MG tablet   Other Visit Diagnoses     Allergic rhinitis, unspecified seasonality, unspecified trigger       Relevant Medications   levocetirizine  (XYZAL) 5 MG tablet   BMI 33.0-33.9,adult       Relevant Orders   CBC with Differential/Platelet   Basic metabolic panel  TSH      Continue current medications. Discussed that her HTN medication is not safe if she decides to consider pregnancy.  Treat allergies. Follow up if not improving. Follow up pending labs.   I have discontinued Natonya Loughner's famotidine and loratadine. I am also having her start on levocetirizine. Additionally, I am having her maintain her Blood Pressure Kit, ferrous sulfate, fluconazole, meclizine, fluticasone, and Slynd.  Meds ordered this encounter  Medications   levocetirizine (XYZAL) 5 MG tablet    Sig: Take 1 tablet (5 mg total) by mouth every evening.    Dispense:  30 tablet    Refill:  2    Order Specific Question:   Supervising Provider    Answer:   Hillard Danker A [[id number]]

## 2023-03-17 NOTE — Patient Instructions (Signed)
Please go to [address] in the basement for labs before 5:15 pm.   Take the elevator to level B

## 2023-03-18 LAB — TSH: TSH: 1.35 u[IU]/mL (ref 0.35–5.50)

## 2023-03-18 LAB — BASIC METABOLIC PANEL
BUN: 7 mg/dL (ref 6–23)
CO2: 26 meq/L (ref 19–32)
Calcium: 9.7 mg/dL (ref 8.4–10.5)
Chloride: 103 meq/L (ref 96–112)
Creatinine, Ser: 0.85 mg/dL (ref 0.40–1.20)
GFR: 94.37 mL/min (ref >60.00)
Glucose, Bld: 96 mg/dL (ref 70–99)
Potassium: 3.8 meq/L (ref 3.5–5.1)
Sodium: 139 meq/L (ref 135–145)

## 2023-04-14 ENCOUNTER — Encounter: Payer: Self-pay | Admitting: Family Medicine

## 2023-04-15 NOTE — Telephone Encounter (Signed)
Please advise 

## 2023-04-16 ENCOUNTER — Encounter: Payer: Self-pay | Admitting: Family Medicine

## 2023-04-16 ENCOUNTER — Ambulatory Visit (INDEPENDENT_AMBULATORY_CARE_PROVIDER_SITE_OTHER): Payer: Medicaid Other | Admitting: Family Medicine

## 2023-04-16 VITALS — BP 132/74 | HR 96 | Temp 97.8°F | Ht 71.0 in | Wt 234.0 lb

## 2023-04-16 DIAGNOSIS — H00014 Hordeolum externum left upper eyelid: Secondary | ICD-10-CM

## 2023-04-16 DIAGNOSIS — I10 Essential (primary) hypertension: Secondary | ICD-10-CM

## 2023-04-16 MED ORDER — SULFAMETHOXAZOLE-TRIMETHOPRIM 800-160 MG PO TABS
1.0000 | ORAL_TABLET | Freq: Two times a day (BID) | ORAL | 0 refills | Status: DC
Start: 2023-04-16 — End: 2023-12-10

## 2023-04-16 NOTE — Progress Notes (Signed)
Subjective:     Patient ID: Audrey Butler, female    DOB: 06/20/1995, 27 y.o.   MRN: [id number]  Chief Complaint  Patient presents with   Eye Problem    Left eye, notied a week ago after she wore makeup    Eye Problem  Pertinent negatives include no blurred vision, eye discharge, double vision, fever, nausea, photophobia or vomiting.     History of Present Illness          C/o of bump on left upper eye lid x 1 wk. No vision changes. No eye pain or drainage.     Health Maintenance Due  Topic Date Due   HPV VACCINES (1 - 3-dose series) Never done    Past Medical History:  Diagnosis Date   Abnormal antenatal test 11/18/2020   High risk T21, declined amnio   Pregnancy induced hypertension    Prior pregnancy complicated by PIH, antepartum, third trimester 07/05/2020   Elev BP x1 in MAU 11/14/20   Supervision of high-risk pregnancy 07/02/2020    Nursing Staff Provider  Office Location  [facility] Dating  LMP  Language  ENGLISH Anatomy US  08-30-20  Flu Vaccine  Declined 07/05/2020 Genetic Screen  NIPS:   AFP:   First Screen:  Quad:    TDaP Vaccine   Declined 11/01/20 Hgb A1C or  GTT Early  Third trimester   COVID Vaccine  NO   LAB RESULTS   Rhogam   Blood Type O/Positive/-- (01/21 1041)   Feeding Plan BOTTLE Antibody Negative (01/21 1041)  Cont   Twin gestation in second trimester 07/05/2020    Past Surgical History:  Procedure Laterality Date   CESAREAN SECTION  12/14/2020   NO PAST SURGERIES      Family History  Problem Relation Age of Onset   High blood pressure Mother    High blood pressure Father    Diabetes Maternal Grandfather    Obesity Neg Hx    Hypertension Neg Hx    Heart disease Neg Hx    Cancer Neg Hx     Social History   Socioeconomic History   Marital status: Single    Spouse name: Not on file   Number of children: 1   Years of education: Not on file   Highest education level: Not on file  Occupational History   Occupation: [employer]  Tobacco Use    Smoking status: Never   Smokeless tobacco: Never  Vaping Use   Vaping status: Never Used  Substance and Sexual Activity   Alcohol use: No   Drug use: No   Sexual activity: Yes    Partners: Male    Birth control/protection: None  Other Topics Concern   Not on file  Social History Narrative   Not on file   Social Determinants of Health   Financial Resource Strain: Not on file  Food Insecurity: Not on file  Transportation Needs: Not on file  Physical Activity: Not on file  Stress: Not on file  Social Connections: Not on file  Intimate Partner Violence: Not on file    Outpatient Medications Prior to Visit  Medication Sig Dispense Refill   Blood Pressure Monitoring (BLOOD PRESSURE KIT) DEVI 1 kit by Does not apply route once a week. Check Blood Pressure regularly and record readings into the [employer] App.  Large Cuff.  DX O90.0 1 each 0   Drospirenone (SLYND) 4 MG TABS Take 1 tablet (4 mg total) by mouth daily. 84 tablet 3  ferrous sulfate 325 (65 FE) MG tablet Take 1 tablet (325 mg total) by mouth every other day. 30 tablet 3   fluconazole (DIFLUCAN) 150 MG tablet Take 1 tablet (150 mg total) by mouth every 3 (three) days as needed. 2 tablet 0   fluticasone (FLONASE) 50 MCG/ACT nasal spray Place 2 sprays into both nostrils daily. 16 g 0   levocetirizine (XYZAL) 5 MG tablet Take 1 tablet (5 mg total) by mouth every evening. 30 tablet 2   meclizine (ANTIVERT) 12.5 MG tablet Take 1-2 tablets (12.5-25 mg total) by mouth 3 (three) times daily as needed for dizziness. 30 tablet 0   NIFEdipine (PROCARDIA-XL/NIFEDICAL-XL) 30 MG 24 hr tablet TAKE 1 TABLET BY MOUTH EVERY DAY 90 tablet 0   triamterene-hydrochlorothiazide (DYAZIDE) 37.5-25 MG capsule TAKE 1 CAPSULE BY MOUTH EVERY DAY 90 capsule 0   No facility-administered medications prior to visit.    No Known Allergies  Review of Systems  Constitutional:  Negative for chills and fever.  HENT:  Negative for congestion and sinus  pain.   Eyes:  Negative for blurred vision, double vision, photophobia, pain and discharge.  Respiratory:  Negative for shortness of breath.   Gastrointestinal:  Negative for abdominal pain, constipation, diarrhea, nausea and vomiting.  Neurological:  Negative for dizziness, focal weakness and headaches.  Psychiatric/Behavioral:  Negative for depression. The patient is not nervous/anxious.        Objective:    Physical Exam Constitutional:      General: She is not in acute distress.    Appearance: She is not ill-appearing.  Eyes:     General: Vision grossly intact. No visual field deficit.       Right eye: No discharge.        Left eye: No discharge.     Extraocular Movements: Extraocular movements intact.     Conjunctiva/sclera: Conjunctivae normal.     Comments: Left upper medial eye lid with hordeolum. No surrounding erythema or edema.   Cardiovascular:     Rate and Rhythm: Normal rate.  Pulmonary:     Effort: Pulmonary effort is normal.  Musculoskeletal:     Cervical back: Normal range of motion and neck supple.  Skin:    General: Skin is warm and dry.  Neurological:     General: No focal deficit present.     Mental Status: She is alert and oriented to person, place, and time.  Psychiatric:        Mood and Affect: Mood normal.        Behavior: Behavior normal.        Thought Content: Thought content normal.      BP 132/74 (BP Location: Left Arm, Patient Position: Sitting, Cuff Size: Large)   Pulse 96   Temp 97.8 F (36.6 C) (Temporal)   Ht 5\' 11"  (1.803 m)   Wt 234 lb (106.1 kg)   SpO2 98%   BMI 32.64 kg/m  Wt Readings from Last 3 Encounters:  04/16/23 234 lb (106.1 kg)  03/17/23 237 lb (107.5 kg)  12/19/21 222 lb (100.7 kg)       Assessment & Plan:   Problem List Items Addressed This Visit       Cardiovascular and Mediastinum   HTN (hypertension), benign   Other Visit Diagnoses     Hordeolum externum of left upper eyelid    -  Primary    Relevant Medications   sulfamethoxazole-trimethoprim (BACTRIM DS) 800-160 MG tablet      Bactrim prescribed.  Warm compresses and follow up if worsening or not improving x 1 wk. If area needs drained she should see Dr. Yetta Barre. He was consulted and examined her today as well.  HTN- improved. Encouraged her to take her medication daily since she takes it some days.   I am having Audrey Butler start on sulfamethoxazole-trimethoprim. I am also having her maintain her Blood Pressure Kit, ferrous sulfate, fluconazole, meclizine, fluticasone, Slynd, NIFEdipine, triamterene-hydrochlorothiazide, and levocetirizine.  Meds ordered this encounter  Medications   sulfamethoxazole-trimethoprim (BACTRIM DS) 800-160 MG tablet    Sig: Take 1 tablet by mouth 2 (two) times daily.    Dispense:  14 tablet    Refill:  0    Order Specific Question:   Supervising Provider    Answer:   Hillard Danker A [[id number]]

## 2023-04-16 NOTE — Patient Instructions (Signed)
Take the antibiotic as prescribed with food.   Use warm compresses.   Let us know if the area is getting worse or not improving in 1 week.

## 2023-07-09 ENCOUNTER — Telehealth: Payer: Medicaid Other | Admitting: Family Medicine

## 2023-07-09 DIAGNOSIS — K219 Gastro-esophageal reflux disease without esophagitis: Secondary | ICD-10-CM

## 2023-07-09 NOTE — Patient Instructions (Signed)
GERD in Adults: What to Know  Gastroesophageal reflux (GER) is when acid from your stomach flows up into your esophagus. Your esophagus is the part of your body that moves food from your mouth to your stomach. Normally, food goes down and stays in your stomach to be digested. But with GER, food and stomach acid may go back up. You may have a disease called gastroesophageal reflux disease (GERD) if the reflux: Happens often. Causes very bad symptoms. Makes your esophagus sore and swollen. Over time, GERD can make small holes called ulcers in the lining of your esophagus. What are the causes? GERD is caused by a problem with the muscle between your esophagus and stomach. This muscle is called the lower esophageal sphincter (LES). When it's weak or not normal, it doesn't close like it should. This means food and stomach acid can go back up into your esophagus. The muscle can be weak if: You smoke or use products with tobacco in them. You're pregnant. You have a type of hernia called a hiatal hernia. You eat certain foods and drinks. These include: Alcohol. Coffee. Chocolate. Onions. Peppermint. What increases the risk? Being overweight. Having a disease that affects your connective tissue. Taking NSAIDs, such as ibuprofen. What are the signs or symptoms? Heartburn. Trouble swallowing. Pain when you swallow. The feeling of having a lump in your throat. A bitter taste in your mouth. Bad breath. Having an upset or bloated stomach. Burping. Chest pain. Other conditions can also cause chest pain. Make sure you see your health care provider if you have chest pain. Wheezing. This is when you make high-pitched whistling sounds when you breathe, most often when you breathe out. A long-term cough or a cough at night. How is this diagnosed? GERD may be diagnosed based on your medical history and a physical exam. You may also have tests. These may include: An endoscopy. This test looks at your  stomach and esophagus with a small camera. A barium swallow test. This shows the shape and size of your esophagus and how well it's working. Tests of your esophagus to check for: Acid levels. Pressure. How is this treated? Treatment may depend on how bad your symptoms are. It may include: Changes to your diet and daily life. Medicines. Surgery. Follow these instructions at home: Eating and drinking Follow an eating plan as told by your provider. You may need to avoid certain foods and drinks. These may include: Coffee and tea, with or without caffeine. Alcohol. Energy drinks and sports drinks. Fizzy drinks or sodas. Chocolate and cocoa. Peppermint and mint flavorings. Garlic and onions. Horseradish. Spicy and acidic foods. These include: Peppers. Chili powder and curry powder. Vinegar. Hot sauces and BBQ sauce. Citrus fruits and juices. These include: Oranges. Lemons. Limes. Tomato-based foods. These include: Red sauce and pizza with red sauce. Chili. Salsa. Fried and fatty foods. These include: Donuts. [place] fries. Potato chips. High-fat dressings. High-fat meats. These include: Hot dogs and sausage. Rib eye steak. Ham and bacon. High-fat dairy items. These include: Whole milk. Butter. Cream cheese. Eat small meals often. Avoid eating big meals. Avoid drinking lots of liquid with your meals. Try not to eat meals during the 2-3 hours before bedtime. Try not to lie down right after you eat. Do not exercise right after you eat. Lifestyle  If you're overweight, lose an amount of weight that's healthy for you. Ask your provider about a safe weight loss goal. Do not smoke, vape, or use nicotine or tobacco. Wear  loose clothes. Do not wear things that are tight around your waist. When you sleep, try: Raising the head of your bed about 6 inches (15 cm). You can use a wedge to do this. Lying down on your left side. Try to lower your stress. If you need help doing  this, ask your provider. General instructions Take your medicines only as told. Do not take aspirin or ibuprofen unless you're told to. Watch for any changes in your symptoms. Do not bend over if it makes your symptoms worse. Contact a health care provider if: You have new symptoms. You have trouble: Drinking. Swallowing. Eating. It hurts to swallow. You have wheezing. You have a cough that won't go away. Your voice is hoarse. Your symptoms don't get better with treatment. Get help right away if: You have pain all of a sudden in your: Arm. Neck. Jaw. Teeth. Back. You feel sweaty, dizzy, or light-headed all of a sudden. You faint. You have chest pain or shortness of breath. You vomit and the vomit is: Green, yellow, or black. Looks like blood or coffee grounds. Your poop is red, bloody, or black. These symptoms may be an emergency. Call 911 right away. Do not wait to see if the symptoms will go away. Do not drive yourself to the hospital. This information is not intended to replace advice given to you by your health care provider. Make sure you discuss any questions you have with your health care provider. Document Revised: 04/13/2023 Document Reviewed: 10/28/2022 Elsevier Patient Education  2024 [employer].

## 2023-07-09 NOTE — Progress Notes (Signed)
Virtual Visit Consent   [name], you are scheduled for a virtual visit with a [facility] provider today. Just as with appointments in the office, your consent must be obtained to participate. Your consent will be active for this visit and any virtual visit you may have with one of our providers in the next 365 days. If you have a MyChart account, a copy of this consent can be sent to you electronically.  As this is a virtual visit, video technology does not allow for your provider to perform a traditional examination. This may limit your provider's ability to fully assess your condition. If your provider identifies any concerns that need to be evaluated in person or the need to arrange testing (such as labs, EKG, etc.), we will make arrangements to do so. Although advances in technology are sophisticated, we cannot ensure that it will always work on either your end or our end. If the connection with a video visit is poor, the visit may have to be switched to a telephone visit. With either a video or telephone visit, we are not always able to ensure that we have a secure connection.  By engaging in this virtual visit, you consent to the provision of healthcare and authorize for your insurance to be billed (if applicable) for the services provided during this visit. Depending on your insurance coverage, you may receive a charge related to this service.  I need to obtain your verbal consent now. Are you willing to proceed with your visit today? [name] has provided verbal consent on 07/09/2023 for a virtual visit (video or telephone). Georgana Curio, FNP  Date: 07/09/2023 6:19 PM  Virtual Visit via Video Note   I, Georgana Curio, connected with  Audrey Butler  ([id number], 1995/10/25) on 07/09/23 at  6:15 PM EST by a video-enabled telemedicine application and verified that I am speaking with the correct person using two identifiers.  Location: Patient: Virtual Visit Location Patient:  Home Provider: Virtual Visit Location Provider: Home Office   I discussed the limitations of evaluation and management by telemedicine and the availability of in person appointments. The patient expressed understanding and agreed to proceed.    History of Present Illness: Audrey Butler is a 28 y.o. who identifies as a female who was assigned female at birth, and is being seen today for stomach making loud noises in class. More gassy. Burning epigastric slightly at times. No abd pain. No fever. No nausea, vomiting or diarrhea. Marland Kitchen  HPI: HPI  Problems:  Patient Active Problem List   Diagnosis Date Noted   Acute non-recurrent pansinusitis 12/19/2021   Left cervical lymphadenopathy 12/19/2021   Post-nasal drainage 12/03/2021   Nausea and vomiting 12/03/2021   Gastroesophageal reflux disease 10/15/2021   Bilateral thoracic back pain 10/15/2021   HTN (hypertension), benign 10/15/2021   Intermittent palpitations 10/15/2021   Elevated blood pressure reading without diagnosis of hypertension 11/14/2020   Anemia during pregnancy in third trimester 04/08/2017    Allergies: No Known Allergies Medications:  Current Outpatient Medications:    Blood Pressure Monitoring (BLOOD PRESSURE KIT) DEVI, 1 kit by Does not apply route once a week. Check Blood Pressure regularly and record readings into the [employer] App.  Large Cuff.  DX O90.0, Disp: 1 each, Rfl: 0   Drospirenone (SLYND) 4 MG TABS, Take 1 tablet (4 mg total) by mouth daily., Disp: 84 tablet, Rfl: 3   ferrous sulfate 325 (65 FE) MG tablet, Take 1 tablet (325 mg total) by mouth  every other day., Disp: 30 tablet, Rfl: 3   fluconazole (DIFLUCAN) 150 MG tablet, Take 1 tablet (150 mg total) by mouth every 3 (three) days as needed., Disp: 2 tablet, Rfl: 0   fluticasone (FLONASE) 50 MCG/ACT nasal spray, Place 2 sprays into both nostrils daily., Disp: 16 g, Rfl: 0   levocetirizine (XYZAL) 5 MG tablet, Take 1 tablet (5 mg total) by mouth every  evening., Disp: 30 tablet, Rfl: 2   meclizine (ANTIVERT) 12.5 MG tablet, Take 1-2 tablets (12.5-25 mg total) by mouth 3 (three) times daily as needed for dizziness., Disp: 30 tablet, Rfl: 0   NIFEdipine (PROCARDIA-XL/NIFEDICAL-XL) 30 MG 24 hr tablet, TAKE 1 TABLET BY MOUTH EVERY DAY, Disp: 90 tablet, Rfl: 0   sulfamethoxazole-trimethoprim (BACTRIM DS) 800-160 MG tablet, Take 1 tablet by mouth 2 (two) times daily., Disp: 14 tablet, Rfl: 0   triamterene-hydrochlorothiazide (DYAZIDE) 37.5-25 MG capsule, TAKE 1 CAPSULE BY MOUTH EVERY DAY, Disp: 90 capsule, Rfl: 0  Observations/Objective: Patient is well-developed, well-nourished in no acute distress.  Resting comfortably  at home.  Head is normocephalic, atraumatic.  No labored breathing.  Speech is clear and coherent with logical content.  Patient is alert and oriented at baseline.    Assessment and Plan: 1. Gastroesophageal reflux disease, unspecified whether esophagitis present (Primary)  Increase fluids, no sodas, limit gassy foods, continue probiotic, follow up with pcp/GI as needed. Start omeprazole otc 20 mg daily for 2-3 weeks.   Follow Up Instructions: I discussed the assessment and treatment plan with the patient. The patient was provided an opportunity to ask questions and all were answered. The patient agreed with the plan and demonstrated an understanding of the instructions.  A copy of instructions were sent to the patient via MyChart unless otherwise noted below.     The patient was advised to call back or seek an in-person evaluation if the symptoms worsen or if the condition fails to improve as anticipated.    Georgana Curio, FNP

## 2023-09-14 ENCOUNTER — Other Ambulatory Visit: Payer: Self-pay | Admitting: Family Medicine

## 2023-09-14 DIAGNOSIS — I10 Essential (primary) hypertension: Secondary | ICD-10-CM

## 2023-12-02 ENCOUNTER — Encounter: Payer: Self-pay | Admitting: Family Medicine

## 2023-12-03 NOTE — Telephone Encounter (Signed)
 LVM for patient to return call regarding an appointment.

## 2023-12-09 ENCOUNTER — Encounter: Payer: Self-pay | Admitting: Family Medicine

## 2023-12-09 ENCOUNTER — Ambulatory Visit (INDEPENDENT_AMBULATORY_CARE_PROVIDER_SITE_OTHER): Admitting: Family Medicine

## 2023-12-09 VITALS — BP 137/87 | HR 95 | Temp 98.0°F | Ht 71.0 in | Wt 245.4 lb

## 2023-12-09 DIAGNOSIS — R141 Gas pain: Secondary | ICD-10-CM

## 2023-12-09 DIAGNOSIS — B360 Pityriasis versicolor: Secondary | ICD-10-CM | POA: Diagnosis not present

## 2023-12-09 DIAGNOSIS — M25511 Pain in right shoulder: Secondary | ICD-10-CM

## 2023-12-09 DIAGNOSIS — T148XXA Other injury of unspecified body region, initial encounter: Secondary | ICD-10-CM

## 2023-12-09 DIAGNOSIS — R140 Abdominal distension (gaseous): Secondary | ICD-10-CM

## 2023-12-09 DIAGNOSIS — K219 Gastro-esophageal reflux disease without esophagitis: Secondary | ICD-10-CM

## 2023-12-09 MED ORDER — FAMOTIDINE 20 MG PO TABS: 20.0000 mg | ORAL_TABLET | Freq: Two times a day (BID) | ORAL | 1 refills | Status: DC

## 2023-12-09 MED ORDER — TRIAMCINOLONE ACETONIDE 0.5 % EX CREA
1.0000 | TOPICAL_CREAM | Freq: Three times a day (TID) | CUTANEOUS | 0 refills | Status: AC
Start: 2023-12-09 — End: ?

## 2023-12-09 MED ORDER — FLUCONAZOLE 150 MG PO TABS: ORAL_TABLET | ORAL | 0 refills | Status: DC

## 2023-12-09 NOTE — Assessment & Plan Note (Signed)
 H. pylori breath test today Discussed hydration and fiber intake

## 2023-12-09 NOTE — Patient Instructions (Signed)
 We are checking labs today, will be in contact with any results that require further attention  I have sent in triamcinolone cream for you.  You may use this twice a day.  I have sent in fluconazole  for you to take 300 mg once weekly for the next 2 weeks.  If H. pylori is positive, will be in contact with antibiotic treatment regimen.  May use heat or ice to the area for relief as needed.  May use topical rubs or gels to the area as needed for relief.  Attached subtle stretching exercises to help relieve pain and strengthen muscles.   Follow-up with me for new or worsening symptoms.   Follow-up with me for new or worsening symptoms.
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
## 2023-12-09 NOTE — Assessment & Plan Note (Signed)
 H. pylori breath test Discussed that she may try to use Gas-X.

## 2023-12-09 NOTE — Assessment & Plan Note (Signed)
 Anxiety Profen or Tylenol  as needed Heat or ice as needed If symptoms are not resolving over the next week or so, let me know and we will get you into physical therapy

## 2023-12-09 NOTE — Assessment & Plan Note (Signed)
 Trial Pepcid  20 mg twice daily Sent to pharmacy

## 2023-12-09 NOTE — Assessment & Plan Note (Signed)
 Rx Diflucan  300 mg once weekly dosing for 2 weeks Triamcinolone to pharmacy for itching

## 2023-12-09 NOTE — Assessment & Plan Note (Signed)
 May use ibuprofen  or Tylenol  as needed May do light stretching Handouts provided for right shoulder rehab

## 2023-12-10 ENCOUNTER — Ambulatory Visit: Payer: Self-pay | Admitting: Family Medicine

## 2023-12-10 ENCOUNTER — Other Ambulatory Visit: Payer: Self-pay | Admitting: Family Medicine

## 2023-12-10 DIAGNOSIS — A048 Other specified bacterial intestinal infections: Secondary | ICD-10-CM

## 2023-12-10 LAB — H. PYLORI BREATH TEST: H. pylori Breath Test: DETECTED — AB

## 2023-12-10 MED ORDER — BISMUTH SUBSALICYLATE 262 MG PO TABS: 262.0000 mg | ORAL_TABLET | Freq: Four times a day (QID) | ORAL | 0 refills | Status: DC

## 2023-12-10 MED ORDER — METRONIDAZOLE 500 MG PO TABS: 500.0000 mg | ORAL_TABLET | Freq: Four times a day (QID) | ORAL | 0 refills | Status: DC

## 2023-12-10 MED ORDER — OMEPRAZOLE 20 MG PO CPDR
20.0000 mg | DELAYED_RELEASE_CAPSULE | Freq: Every day | ORAL | 0 refills | Status: DC
Start: 2023-12-10 — End: 2024-01-09

## 2023-12-10 MED ORDER — TETRACYCLINE HCL 500 MG PO CAPS: 500.0000 mg | ORAL_CAPSULE | Freq: Four times a day (QID) | ORAL | 0 refills | Status: DC

## 2023-12-11 ENCOUNTER — Other Ambulatory Visit: Payer: Self-pay | Admitting: Family Medicine

## 2023-12-11 ENCOUNTER — Other Ambulatory Visit (HOSPITAL_COMMUNITY): Payer: Self-pay

## 2023-12-11 DIAGNOSIS — A048 Other specified bacterial intestinal infections: Secondary | ICD-10-CM

## 2023-12-11 MED ORDER — TETRACYCLINE HCL 500 MG PO TABS: 500.0000 mg | ORAL_TABLET | Freq: Four times a day (QID) | ORAL | 0 refills | Status: DC

## 2023-12-11 MED ORDER — TETRACYCLINE HCL 500 MG PO CAPS
500.0000 mg | ORAL_CAPSULE | Freq: Four times a day (QID) | ORAL | 0 refills | Status: DC
  Filled 2023-12-11: qty 56, 14d supply, fill #0

## 2023-12-11 MED ORDER — PYLERA 140-125-125 MG PO CAPS: 1.0000 | ORAL_CAPSULE | Freq: Four times a day (QID) | ORAL | 0 refills | Status: DC

## 2023-12-12 ENCOUNTER — Other Ambulatory Visit: Payer: Self-pay | Admitting: Family Medicine

## 2023-12-12 DIAGNOSIS — I10 Essential (primary) hypertension: Secondary | ICD-10-CM

## 2023-12-16 ENCOUNTER — Ambulatory Visit: Admitting: Family Medicine

## 2024-01-09 ENCOUNTER — Other Ambulatory Visit: Payer: Self-pay | Admitting: Family Medicine

## 2024-01-09 DIAGNOSIS — A048 Other specified bacterial intestinal infections: Secondary | ICD-10-CM

## 2024-03-09 ENCOUNTER — Other Ambulatory Visit: Payer: Self-pay | Admitting: Family Medicine

## 2024-03-09 DIAGNOSIS — I10 Essential (primary) hypertension: Secondary | ICD-10-CM

## 2024-05-24 ENCOUNTER — Telehealth: Admitting: Physician Assistant

## 2024-05-24 DIAGNOSIS — B9689 Other specified bacterial agents as the cause of diseases classified elsewhere: Secondary | ICD-10-CM

## 2024-05-24 DIAGNOSIS — J019 Acute sinusitis, unspecified: Secondary | ICD-10-CM

## 2024-05-24 MED ORDER — IPRATROPIUM BROMIDE 0.03 % NA SOLN: 2.0000 | Freq: Two times a day (BID) | NASAL | 0 refills | Status: AC

## 2024-05-24 MED ORDER — FLUCONAZOLE 150 MG PO TABS: ORAL_TABLET | ORAL | 0 refills | Status: AC

## 2024-05-24 MED ORDER — AMOXICILLIN-POT CLAVULANATE 875-125 MG PO TABS: 1.0000 | ORAL_TABLET | Freq: Two times a day (BID) | ORAL | 0 refills | Status: AC

## 2024-05-24 MED ORDER — BENZONATATE 100 MG PO CAPS: 100.0000 mg | ORAL_CAPSULE | Freq: Three times a day (TID) | ORAL | 0 refills | Status: AC | PRN

## 2024-05-24 NOTE — Addendum Note (Signed)
 Addended by: GLADIS ELSIE BROCKS on: 05/24/2024 03:33 PM   Modules accepted: Orders

## 2024-05-24 NOTE — Progress Notes (Signed)
 Virtual Visit Consent   Audrey Butler, you are scheduled for a virtual visit with a [facility] provider today. Just as with appointments in the office, your consent must be obtained to participate. Your consent will be active for this visit and any virtual visit you may have with one of our providers in the next 365 days. If you have a MyChart account, a copy of this consent can be sent to you electronically.  As this is a virtual visit, video technology does not allow for your provider to perform a traditional examination. This may limit your provider's ability to fully assess your condition. If your provider identifies any concerns that need to be evaluated in person or the need to arrange testing (such as labs, EKG, etc.), we will make arrangements to do so. Although advances in technology are sophisticated, we cannot ensure that it will always work on either your end or our end. If the connection with a video visit is poor, the visit may have to be switched to a telephone visit. With either a video or telephone visit, we are not always able to ensure that we have a secure connection.  By engaging in this virtual visit, you consent to the provision of healthcare and authorize for your insurance to be billed (if applicable) for the services provided during this visit. Depending on your insurance coverage, you may receive a charge related to this service.  I need to obtain your verbal consent now. Are you willing to proceed with your visit today? Audrey Butler has provided verbal consent on 05/24/2024 for a virtual visit (video or telephone). Audrey Butler, [place]  Date: 05/24/2024 3:10 PM   Virtual Visit via Video Note   I, Audrey Butler, connected with  Audrey Butler  ([id number], [birth year]) on 05/24/24 at  3:15 PM EST by a video-enabled telemedicine application and verified that I am speaking with the correct person using two identifiers.  Location: Patient: Virtual  Visit Location Patient: Home Provider: Virtual Visit Location Provider: Home Office   I discussed the limitations of evaluation and management by telemedicine and the availability of in person appointments. The patient expressed understanding and agreed to proceed.    History of Present Illness: Audrey Butler is a 28 y.o. who identifies as a female who was assigned female at birth, and is being seen today for 2 weeks of head fullness, followed by nasal congestion and sinus pressure. More substantial headache over past few days.  Now with more post-nasal drip, sore throat, cough when lying down due to drainage. Denies chest pain or SOB. Now nasal discharge is thick and yellow/green w th sinus pain. Denies ear pain or tooth pain.   BP 127/90  OTC -- Mucinex, herbal Teas  HPI: HPI  Problems:  Patient Active Problem List   Diagnosis Date Noted   Tinea versicolor 12/09/2023   Abdominal bloating 12/09/2023   Abdominal gas pain 12/09/2023   Muscle strain 12/09/2023   Acute pain of right shoulder 12/09/2023   Acute non-recurrent pansinusitis 12/19/2021   Left cervical lymphadenopathy 12/19/2021   Post-nasal drainage 12/03/2021   Nausea and vomiting 12/03/2021   Gastroesophageal reflux disease 10/15/2021   Bilateral thoracic back pain 10/15/2021   HTN (hypertension), benign 10/15/2021   Intermittent palpitations 10/15/2021   Elevated blood pressure reading without diagnosis of hypertension 11/14/2020   Anemia during pregnancy in third trimester 04/08/2017    Allergies: No Known Allergies Medications:  Current Outpatient Medications:    amoxicillin -clavulanate (AUGMENTIN ) 875-125  MG tablet, Take 1 tablet by mouth 2 (two) times daily., Disp: 14 tablet, Rfl: 0   benzonatate (TESSALON) 100 MG capsule, Take 1 capsule (100 mg total) by mouth 3 (three) times daily as needed for cough., Disp: 30 capsule, Rfl: 0   ipratropium (ATROVENT) 0.03 % nasal spray, Place 2 sprays into both nostrils  every 12 (twelve) hours., Disp: 30 mL, Rfl: 0   Blood Pressure Monitoring (BLOOD PRESSURE KIT) DEVI, 1 kit by Does not apply route once a week. Check Blood Pressure regularly and record readings into the [employer] App.  Large Cuff.  DX O90.0, Disp: 1 each, Rfl: 0   Drospirenone  (SLYND ) 4 MG TABS, Take 1 tablet (4 mg total) by mouth daily., Disp: 84 tablet, Rfl: 3   ferrous sulfate  325 (65 FE) MG tablet, Take 1 tablet (325 mg total) by mouth every other day., Disp: 30 tablet, Rfl: 3   levocetirizine (XYZAL ) 5 MG tablet, Take 1 tablet (5 mg total) by mouth every evening., Disp: 30 tablet, Rfl: 2   loratadine  (CLARITIN ) 10 MG tablet, TAKE 1 TABLET BY MOUTH EVERY DAY, Disp: 90 tablet, Rfl: 1   NIFEdipine  (PROCARDIA -XL/NIFEDICAL-XL) 30 MG 24 hr tablet, TAKE 1 TABLET BY MOUTH EVERY DAY. Please schedule follow up for refills., Disp: 90 tablet, Rfl: 0   omeprazole  (PRILOSEC) 20 MG capsule, TAKE 1 CAPSULE BY MOUTH EVERY DAY, Disp: 30 capsule, Rfl: 0   triamcinolone  cream (KENALOG ) 0.5 %, Apply 1 Application topically 3 (three) times daily., Disp: 30 g, Rfl: 0   triamterene -hydrochlorothiazide (DYAZIDE) 37.5-25 MG capsule, TAKE 1 CAPSULE BY MOUTH EVERY DAY. Please schedule follow up for refills., Disp: 90 capsule, Rfl: 0  Observations/Objective: Patient is well-developed, well-nourished in no acute distress.  Resting comfortably  at home.  Head is normocephalic, atraumatic.  No labored breathing.  Speech is clear and coherent with logical content.  Patient is alert and oriented at baseline.   Assessment and Plan: 1. Acute bacterial sinusitis (Primary) - ipratropium (ATROVENT) 0.03 % nasal spray; Place 2 sprays into both nostrils every 12 (twelve) hours.  Dispense: 30 mL; Refill: 0 - benzonatate (TESSALON) 100 MG capsule; Take 1 capsule (100 mg total) by mouth 3 (three) times daily as needed for cough.  Dispense: 30 capsule; Refill: 0 - amoxicillin -clavulanate (AUGMENTIN ) 875-125 MG tablet; Take 1  tablet by mouth 2 (two) times daily.  Dispense: 14 tablet; Refill: 0  Rx Augmentin .  Increase fluids.  Rest.  Saline nasal spray.  Probiotic.  Mucinex as directed.  Humidifier in bedroom. Tessalon and Atrovent.  Call or return to clinic if symptoms are not improving.   Follow Up Instructions: I discussed the assessment and treatment plan with the patient. The patient was provided an opportunity to ask questions and all were answered. The patient agreed with the plan and demonstrated an understanding of the instructions.  A copy of instructions were sent to the patient via MyChart unless otherwise noted below.   The patient was advised to call back or seek an in-person evaluation if the symptoms worsen or if the condition fails to improve as anticipated.    Audrey Velma Lunger, PA-C

## 2024-05-24 NOTE — Patient Instructions (Signed)
 Audrey Butler, thank you for joining Audrey Velma Lunger, PA-C for today's virtual visit.  While this provider is not your primary care provider (PCP), if your PCP is located in our provider database this encounter information will be shared with them immediately following your visit.   A [facility] MyChart account gives you access to today's visit and all your visits, tests, and labs performed at [facility]  click here if you don't have a [facility] MyChart account or go to [email or web address]  Consent: (Patient) Audrey Butler provided verbal consent for this virtual visit at the beginning of the encounter.  Current Medications:  Current Outpatient Medications:    Bismuth /Metronidaz/Tetracyclin (PYLERA ) 140-125-125 MG CAPS, Take 1 capsule by mouth in the morning, at noon, in the evening, and at bedtime for 10 days., Disp: 40 capsule, Rfl: 0   Blood Pressure Monitoring (BLOOD PRESSURE KIT) DEVI, 1 kit by Does not apply route once a week. Check Blood Pressure regularly and record readings into the [employer] App.  Large Cuff.  DX O90.0, Disp: 1 each, Rfl: 0   Drospirenone  (SLYND ) 4 MG TABS, Take 1 tablet (4 mg total) by mouth daily., Disp: 84 tablet, Rfl: 3   famotidine  (PEPCID ) 20 MG tablet, Take 1 tablet (20 mg total) by mouth 2 (two) times daily., Disp: 60 tablet, Rfl: 1   ferrous sulfate  325 (65 FE) MG tablet, Take 1 tablet (325 mg total) by mouth every other day., Disp: 30 tablet, Rfl: 3   fluconazole  (DIFLUCAN ) 150 MG tablet, Take 2 tablets today, then take 2 tablets in 1 week, Disp: 4 tablet, Rfl: 0   fluticasone  (FLONASE ) 50 MCG/ACT nasal spray, Place 2 sprays into both nostrils daily., Disp: 16 g, Rfl: 0   levocetirizine (XYZAL ) 5 MG tablet, Take 1 tablet (5 mg total) by mouth every evening., Disp: 30 tablet, Rfl: 2   loratadine  (CLARITIN ) 10 MG tablet, TAKE 1 TABLET BY MOUTH EVERY DAY, Disp: 90 tablet, Rfl: 1   meclizine  (ANTIVERT ) 12.5 MG tablet, Take 1-2  tablets (12.5-25 mg total) by mouth 3 (three) times daily as needed for dizziness., Disp: 30 tablet, Rfl: 0   NIFEdipine  (PROCARDIA -XL/NIFEDICAL-XL) 30 MG 24 hr tablet, TAKE 1 TABLET BY MOUTH EVERY DAY. Please schedule follow up for refills., Disp: 90 tablet, Rfl: 0   omeprazole  (PRILOSEC) 20 MG capsule, TAKE 1 CAPSULE BY MOUTH EVERY DAY, Disp: 30 capsule, Rfl: 0   triamcinolone  cream (KENALOG ) 0.5 %, Apply 1 Application topically 3 (three) times daily., Disp: 30 g, Rfl: 0   triamterene -hydrochlorothiazide (DYAZIDE) 37.5-25 MG capsule, TAKE 1 CAPSULE BY MOUTH EVERY DAY. Please schedule follow up for refills., Disp: 90 capsule, Rfl: 0   Medications ordered in this encounter:  No orders of the defined types were placed in this encounter.    *If you need refills on other medications prior to your next appointment, please contact your pharmacy*  Follow-Up: Call back or seek an in-person evaluation if the symptoms worsen or if the condition fails to improve as anticipated.  [facility] [phone]  Other Instructions Please take antibiotic as directed.  Increase fluid intake.  Use Saline nasal spray.  Take a daily multivitamin. Ok to continue Mucinex OTC. Take other prescribed medications as directed.  Place a humidifier in the bedroom.  If you note any non-resolving, new, or worsening symptoms despite treatment, please seek an in-person evaluation ASAP.   Sinusitis Sinusitis is redness, soreness, and swelling (inflammation) of the paranasal sinuses. Paranasal sinuses are  air pockets within the bones of your face (beneath the eyes, the middle of the forehead, or above the eyes). In healthy paranasal sinuses, mucus is able to drain out, and air is able to circulate through them by way of your nose. However, when your paranasal sinuses are inflamed, mucus and air can become trapped. This can allow bacteria and other germs to grow and cause infection. Sinusitis can develop quickly and  last only a short time (acute) or continue over a long period (chronic). Sinusitis that lasts for more than 12 weeks is considered chronic.  CAUSES  Causes of sinusitis include: Allergies. Structural abnormalities, such as displacement of the cartilage that separates your nostrils (deviated septum), which can decrease the air flow through your nose and sinuses and affect sinus drainage. Functional abnormalities, such as when the small hairs (cilia) that line your sinuses and help remove mucus do not work properly or are not present. SYMPTOMS  Symptoms of acute and chronic sinusitis are the same. The primary symptoms are pain and pressure around the affected sinuses. Other symptoms include: Upper toothache. Earache. Headache. Bad breath. Decreased sense of smell and taste. A cough, which worsens when you are lying flat. Fatigue. Fever. Thick drainage from your nose, which often is green and may contain pus (purulent). Swelling and warmth over the affected sinuses. DIAGNOSIS  Your caregiver will perform a physical exam. During the exam, your caregiver may: Look in your nose for signs of abnormal growths in your nostrils (nasal polyps). Tap over the affected sinus to check for signs of infection. View the inside of your sinuses (endoscopy) with a special imaging device with a light attached (endoscope), which is inserted into your sinuses. If your caregiver suspects that you have chronic sinusitis, one or more of the following tests may be recommended: Allergy tests. Nasal culture A sample of mucus is taken from your nose and sent to a lab and screened for bacteria. Nasal cytology A sample of mucus is taken from your nose and examined by your caregiver to determine if your sinusitis is related to an allergy. TREATMENT  Most cases of acute sinusitis are related to a viral infection and will resolve on their own within 10 days. Sometimes medicines are prescribed to help relieve symptoms (pain  medicine, decongestants, nasal steroid sprays, or saline sprays).  However, for sinusitis related to a bacterial infection, your caregiver will prescribe antibiotic medicines. These are medicines that will help kill the bacteria causing the infection.  Rarely, sinusitis is caused by a fungal infection. In theses cases, your caregiver will prescribe antifungal medicine. For some cases of chronic sinusitis, surgery is needed. Generally, these are cases in which sinusitis recurs more than 3 times per year, despite other treatments. HOME CARE INSTRUCTIONS  Drink plenty of water. Water helps thin the mucus so your sinuses can drain more easily. Use a humidifier. Inhale steam 3 to 4 times a day (for example, sit in the bathroom with the shower running). Apply a warm, moist washcloth to your face 3 to 4 times a day, or as directed by your caregiver. Use saline nasal sprays to help moisten and clean your sinuses. Take over-the-counter or prescription medicines for pain, discomfort, or fever only as directed by your caregiver. SEEK IMMEDIATE MEDICAL CARE IF: You have increasing pain or severe headaches. You have nausea, vomiting, or drowsiness. You have swelling around your face. You have vision problems. You have a stiff neck. You have difficulty breathing. MAKE SURE YOU:  Understand  these instructions. Will watch your condition. Will get help right away if you are not doing well or get worse. Document Released: 06/01/2005 Document Revised: 08/24/2011 Document Reviewed: 06/16/2011 [facility] Patient Information 2014 [place], [place].    If you have been instructed to have an in-person evaluation today at a local Urgent Care facility, please use the link below. It will take you to a list of all of our available [facility], including address, phone number and hours of operation. Please do not delay care.  [facility]  If you or a family member do not have a primary care  provider, use the link below to schedule a visit and establish care. When you choose a [place] primary care physician or advanced practice provider, you gain a long-term partner in health. Find a Primary Care Provider  Learn more about [facility] in-office and virtual care options: [facility] - Get Care Now

## 2024-06-09 ENCOUNTER — Telehealth

## 2024-06-09 ENCOUNTER — Ambulatory Visit: Admitting: Family Medicine

## 2024-06-12 ENCOUNTER — Ambulatory Visit: Admitting: Family Medicine
